# Patient Record
Sex: Male | Born: 1960 | Race: White | Hispanic: No | Marital: Married | Smoking: Current every day smoker
Health system: Southern US, Community
[De-identification: ages and names within clinical notes are randomized; demographics above are authoritative.]

## PROBLEM LIST (undated history)

## (undated) DIAGNOSIS — R0789 Other chest pain: Secondary | ICD-10-CM

## (undated) HISTORY — PX: TONSILLECTOMY: SUR1361

## (undated) HISTORY — DX: Other chest pain: R07.89

## (undated) HISTORY — PX: VASECTOMY: SHX75

---

## 1998-03-04 ENCOUNTER — Encounter: Payer: Self-pay | Admitting: Hematology & Oncology

## 1998-03-04 ENCOUNTER — Ambulatory Visit (HOSPITAL_COMMUNITY): Admission: RE | Admit: 1998-03-04 | Discharge: 1998-03-04 | Payer: Self-pay | Admitting: Hematology & Oncology

## 2007-05-26 ENCOUNTER — Emergency Department (HOSPITAL_COMMUNITY): Admission: EM | Admit: 2007-05-26 | Discharge: 2007-05-26 | Payer: Self-pay | Admitting: Emergency Medicine

## 2011-01-30 LAB — POCT I-STAT CREATININE: Operator id: 146091

## 2011-01-30 LAB — D-DIMER, QUANTITATIVE: D-Dimer, Quant: 0.35

## 2011-01-30 LAB — I-STAT 8, (EC8 V) (CONVERTED LAB)
Chloride: 108
Glucose, Bld: 98
Potassium: 4.2
pCO2, Ven: 44.1 — ABNORMAL LOW
pH, Ven: 7.409 — ABNORMAL HIGH

## 2011-01-30 LAB — POCT CARDIAC MARKERS
CKMB, poc: <1 — ABNORMAL LOW
Myoglobin, poc: 114
Myoglobin, poc: 123
Operator id: 146091
Operator id: 261381
Troponin i, poc: <0.05

## 2011-09-28 ENCOUNTER — Encounter: Payer: Self-pay | Admitting: Cardiology

## 2011-09-28 ENCOUNTER — Ambulatory Visit (INDEPENDENT_AMBULATORY_CARE_PROVIDER_SITE_OTHER): Payer: PRIVATE HEALTH INSURANCE | Admitting: Cardiology

## 2011-09-28 DIAGNOSIS — Z720 Tobacco use: Secondary | ICD-10-CM

## 2011-09-28 DIAGNOSIS — F17200 Nicotine dependence, unspecified, uncomplicated: Secondary | ICD-10-CM

## 2011-09-28 DIAGNOSIS — R079 Chest pain, unspecified: Secondary | ICD-10-CM | POA: Insufficient documentation

## 2011-09-28 NOTE — Patient Instructions (Addendum)
The current medical regimen is effective;  continue present plan and medications.  Your physician has requested that you have an exercise tolerance test. For further information please visit [email or web address] Please also follow instruction sheet, as given.  Smoking Cessation, Tips for Success YOU CAN QUIT SMOKING If you are ready to quit smoking, congratulations! You have chosen to help yourself be healthier. Cigarettes bring nicotine, tar, carbon monoxide, and other irritants into your body. Your lungs, heart, and blood vessels will be able to work better without these poisons. There are many different ways to quit smoking. Nicotine gum, nicotine patches, a nicotine inhaler, or nicotine nasal spray can help with physical craving. Hypnosis, support groups, and medicines help break the habit of smoking. Here are some tips to help you quit for good.  Throw away all cigarettes.   Clean and remove all ashtrays from your home, work, and car.   On a card, write down your reasons for quitting. Carry the card with you and read it when you get the urge to smoke.   Cleanse your body of nicotine. Drink enough water and fluids to keep your urine clear or pale yellow. Do this after quitting to flush the nicotine from your body.   Learn to predict your moods. Do not let a bad situation be your excuse to have a cigarette. Some situations in your life might tempt you into wanting a cigarette.   Never have "just one" cigarette. It leads to wanting another and another. Remind yourself of your decision to quit.   Change habits associated with smoking. If you smoked while driving or when feeling stressed, try other activities to replace smoking. Stand up when drinking your coffee. Brush your teeth after eating. Sit in a different chair when you read the paper. Avoid alcohol while trying to quit, and try to drink fewer caffeinated beverages. Alcohol and caffeine may urge you to smoke.   Avoid foods and drinks  that can trigger a desire to smoke, such as sugary or spicy foods and alcohol.   Ask people who smoke not to smoke around you.   Have something planned to do right after eating or having a cup of coffee. Take a walk or exercise to perk you up. This will help to keep you from overeating.   Try a relaxation exercise to calm you down and decrease your stress. Remember, you may be tense and nervous for the first 2 weeks after you quit, but this will pass.   Find new activities to keep your hands busy. Play with a pen, coin, or rubber band. Doodle or draw things on paper.   Brush your teeth right after eating. This will help cut down on the craving for the taste of tobacco after meals. You can try mouthwash, too.   Use oral substitutes, such as lemon drops, carrots, a cinnamon stick, or chewing gum, in place of cigarettes. Keep them handy so they are available when you have the urge to smoke.   When you have the urge to smoke, try deep breathing.   Designate your home as a nonsmoking area.   If you are a heavy smoker, ask your caregiver about a prescription for nicotine chewing gum. It can ease your withdrawal from nicotine.   Reward yourself. Set aside the cigarette money you save and buy yourself something nice.   Look for support from others. Join a support group or smoking cessation program. Ask someone at home or at work to help you with  your plan to quit smoking.   Always ask yourself, "Do I need this cigarette or is this just a reflex?" Tell yourself, "Today, I choose not to smoke," or "I do not want to smoke." You are reminding yourself of your decision to quit, even if you do smoke a cigarette.  HOW WILL I FEEL WHEN I QUIT SMOKING?  The benefits of not smoking start within days of quitting.   You may have symptoms of withdrawal because your body is used to nicotine (the addictive substance in cigarettes). You may crave cigarettes, be irritable, feel very hungry, cough often, get  headaches, or have difficulty concentrating.   The withdrawal symptoms are only temporary. They are strongest when you first quit but will go away within 10 to 14 days.   When withdrawal symptoms occur, stay in control. Think about your reasons for quitting. Remind yourself that these are signs that your body is healing and getting used to being without cigarettes.   Remember that withdrawal symptoms are easier to treat than the major diseases that smoking can cause.   Even after the withdrawal is over, expect periodic urges to smoke. However, these cravings are generally short-lived and will go away whether you smoke or not. Do not smoke!   If you relapse and smoke again, do not lose hope. Most smokers quit 3 times before they are successful.   If you relapse, do not give up! Plan ahead and think about what you will do the next time you get the urge to smoke.  LIFE AS A NONSMOKER: MAKE IT FOR A MONTH, MAKE IT FOR LIFE Day 1: Hang this page where you will see it every day. Day 2: Get rid of all ashtrays, matches, and lighters. Day 3: Drink water. Breathe deeply between sips. Day 4: Avoid places with smoke-filled air, such as bars, clubs, or the smoking section of restaurants. Day 5: Keep track of how much money you save by not smoking. Day 6: Avoid boredom. Keep a good book with you or go to the movies. Day 7: Reward yourself! One week without smoking! Day 8: Make a dental appointment to get your teeth cleaned. Day 9: Decide how you will turn down a cigarette before it is offered to you. Day 10: Review your reasons for quitting. Day 11: Distract yourself. Stay active to keep your mind off smoking and to relieve tension. Take a walk, exercise, read a book, do a crossword puzzle, or try a new hobby. Day 12: Exercise. Get off the bus before your stop or use stairs instead of escalators. Day 13: Call on friends for support and encouragement. Day 14: Reward yourself! Two weeks without  smoking! Day 15: Practice deep breathing exercises. Day 16: Bet a friend that you can stay a nonsmoker. Day 17: Ask to sit in nonsmoking sections of restaurants. Day 18: Hang up "No Smoking" signs. Day 19: Think of yourself as a nonsmoker. Day 20: Each morning, tell yourself you will not smoke. Day 21: Reward yourself! Three weeks without smoking! Day 22: Think of smoking in negative ways. Remember how it stains your teeth, gives you bad breath, and leaves you short of breath. Day 23: Eat a nutritious breakfast. Day 24:Do not relive your days as a smoker. Day 25: Hold a pencil in your hand when talking on the telephone. Day 26: Tell all your friends you do not smoke. Day 27: Think about how much better food tastes. Day 28: Remember, one cigarette is one too  many. Day 29: Take up a hobby that will keep your hands busy. Day 30: Congratulations! One month without smoking! Give yourself a big reward. Your caregiver can direct you to community resources or hospitals for support, which may include:  Group support.   Education.   Hypnosis.   Subliminal therapy.  Document Released: 01/24/2004 Document Revised: 04/16/2011 Document Reviewed: 02/11/2009 [facility] Patient Information 2012 [place], [place].

## 2011-09-28 NOTE — Progress Notes (Signed)
   HPI The patient presents for evaluation of chest discomfort. He has no prior cardiac history. He had a negative exercise treadmill test in 2009. She reports about one year of chest discomfort. This has been intermittent. He points to a left axillary discomfort that is sharp and stabbing. He might have some sharp fleeting neck discomfort associated with this. He cannot bring this on with activity. Recently he gains for 3 hours without bringing on this discomfort. He does not describe associated symptoms such as nausea vomiting or diaphoresis. He has no new shortness of breath, PND or orthopnea. He does however have cardiovascular risk factors such as tobacco use. His mother also had sudden  death at age 58. She however was not found to have atherosclerotic disease apparently.  No Known Allergies  Current Outpatient Prescriptions  Medication Sig Dispense Refill  . Multiple Vitamins-Minerals (CENTRUM PO) Take by mouth.      . pseudoephedrine (SUDAFED) 30 MG tablet Take 30 mg by mouth every 4 (four) hours as needed.        Past Medical History  Diagnosis Date  . Left-sided chest wall pain   . Bradycardia     Past Surgical History  Procedure Date  . Vasectomy   . Tonsillectomy     No family history on file.  History   Social History  . Marital Status: Married    Spouse Name: N/A    Number of Children: N/A  . Years of Education: N/A   Occupational History  . Not on file.   Social History Main Topics  . Smoking status: Current Everyday Smoker  . Smokeless tobacco: Not on file  . Alcohol Use: Yes  . Drug Use:   . Sexually Active:    Other Topics Concern  . Not on file   Social History Narrative  . No narrative on file    ROS:  Positive for dizziness, seasonal allergies. Otherwise as stated in the HPI and negative for all other systems.  PHYSICAL EXAM BP 154/81  Pulse 61  Ht 6\' 1"  (1.854 m)  Wt 189 lb 1.9 oz (85.784 kg)  BMI 24.95 kg/m2 GENERAL:  Well  appearing HEENT:  Pupils equal round and reactive, fundi not visualized, oral mucosa unremarkable NECK:  No jugular venous distention, waveform within normal limits, carotid upstroke brisk and symmetric, no bruits, no thyromegaly LYMPHATICS:  No cervical, inguinal adenopathy LUNGS:  Clear to auscultation bilaterally BACK:  No CVA tenderness CHEST:  Unremarkable HEART:  PMI not displaced or sustained,S1 and S2 within normal limits, no S3, no S4, no clicks, no rubs, no murmurs ABD:  Flat, positive bowel sounds normal in frequency in pitch, no bruits, no rebound, no guarding, no midline pulsatile mass, no hepatomegaly, no splenomegaly EXT:  2 plus pulses throughout, no edema, no cyanosis no clubbing SKIN:  No rashes no nodules NEURO:  Cranial nerves II through XII grossly intact, motor grossly intact throughout PSYCH:  Cognitively intact, oriented to person place and time  EKG: Sinus rhythm, rate 61, axis within normal limits, intervals within normal limits, no acute ST-T wave changes.  ASSESSMENT AND PLAN

## 2011-09-28 NOTE — Assessment & Plan Note (Signed)
I think the pretest probability of obstructive coronary disease is somewhat low. He does however have risk factors. I will bring the patient back for a POET (Plain Old Exercise Test). This will allow me to screen for obstructive coronary disease, risk stratify and very importantly provide a prescription for exercise.

## 2011-09-28 NOTE — Assessment & Plan Note (Signed)
We discussed a specific strategy for tobacco cessation.  (Greater than 10 minutes discussing tobacco cessation.)  He wants to coordinate this with his wife. He will consider Chantix versus other strategies we discussed. I hope that he is committed to quit smoking.

## 2011-11-02 ENCOUNTER — Ambulatory Visit (INDEPENDENT_AMBULATORY_CARE_PROVIDER_SITE_OTHER): Payer: PRIVATE HEALTH INSURANCE | Admitting: Cardiology

## 2011-11-02 ENCOUNTER — Encounter: Payer: Self-pay | Admitting: Cardiology

## 2011-11-02 DIAGNOSIS — R079 Chest pain, unspecified: Secondary | ICD-10-CM

## 2011-11-02 NOTE — Procedures (Signed)
Exercise Treadmill Test  Pre-Exercise Testing Evaluation Rhythm: normal sinus  Rate: 65   PR:  .13 QRS:  .09  QT:  .39 QTc: .40     Test  Exercise Tolerance Test Ordering MD: Angelina Sheriff, MD  Interpreting MD: Angelina Sheriff, MD  Unique Test No: 1  Treadmill:  1  Indication for ETT: chest pain - rule out ischemia  Contraindication to ETT: No   Stress Modality: exercise - treadmill  Cardiac Imaging Performed: non   Protocol: standard Bruce - maximal  Max BP:  190/69  Max MPHR (bpm):  170 85% MPR (bpm):  145  MPHR obtained (bpm):  151 % MPHR obtained:  86  Reached 85% MPHR (min:sec):  7:02 Total Exercise Time (min-sec):  8:00  Workload in METS:  10.1 Borg Scale: 17  Reason ETT Terminated:  desired heart rate attained    ST Segment Analysis At Rest: normal ST segments - no evidence of significant ST depression With Exercise: no evidence of significant ST depression  Other Information Arrhythmia:  Yes Angina during ETT:  absent (0) Quality of ETT:  diagnostic  ETT Interpretation:  normal - no evidence of ischemia by ST analysis  Comments: The patient had an moderate exercise tolerance.  There was no chest pain.  There was an appropriate level of dyspnea.  There was a normal heart rate response and normal BP response.  He had one ventricular couplet in recovery and rare PVCs. There were no ischemic ST T wave changes.  Recommendations: Negative adequate ETT.  No further testing is indicated.  Based on the above I gave the patient a prescription for exercise.

## 2011-11-03 ENCOUNTER — Encounter: Payer: Self-pay | Admitting: Cardiology

## 2016-11-17 DIAGNOSIS — L989 Disorder of the skin and subcutaneous tissue, unspecified: Secondary | ICD-10-CM | POA: Diagnosis not present

## 2017-01-05 DIAGNOSIS — C44519 Basal cell carcinoma of skin of other part of trunk: Secondary | ICD-10-CM | POA: Diagnosis not present

## 2017-02-15 DIAGNOSIS — Z85828 Personal history of other malignant neoplasm of skin: Secondary | ICD-10-CM | POA: Diagnosis not present

## 2017-02-15 DIAGNOSIS — Z08 Encounter for follow-up examination after completed treatment for malignant neoplasm: Secondary | ICD-10-CM | POA: Diagnosis not present

## 2017-06-09 DIAGNOSIS — D225 Melanocytic nevi of trunk: Secondary | ICD-10-CM | POA: Diagnosis not present

## 2017-06-09 DIAGNOSIS — L988 Other specified disorders of the skin and subcutaneous tissue: Secondary | ICD-10-CM | POA: Diagnosis not present

## 2017-07-07 DIAGNOSIS — Z08 Encounter for follow-up examination after completed treatment for malignant neoplasm: Secondary | ICD-10-CM | POA: Diagnosis not present

## 2017-07-07 DIAGNOSIS — Z85828 Personal history of other malignant neoplasm of skin: Secondary | ICD-10-CM | POA: Diagnosis not present

## 2017-07-21 DIAGNOSIS — J101 Influenza due to other identified influenza virus with other respiratory manifestations: Secondary | ICD-10-CM | POA: Diagnosis not present

## 2017-07-21 DIAGNOSIS — F17200 Nicotine dependence, unspecified, uncomplicated: Secondary | ICD-10-CM | POA: Diagnosis not present

## 2018-03-02 DIAGNOSIS — R030 Elevated blood-pressure reading, without diagnosis of hypertension: Secondary | ICD-10-CM | POA: Diagnosis not present

## 2018-03-02 DIAGNOSIS — L989 Disorder of the skin and subcutaneous tissue, unspecified: Secondary | ICD-10-CM | POA: Diagnosis not present

## 2018-03-02 DIAGNOSIS — F17210 Nicotine dependence, cigarettes, uncomplicated: Secondary | ICD-10-CM | POA: Diagnosis not present

## 2019-03-21 ENCOUNTER — Other Ambulatory Visit: Payer: Self-pay

## 2019-03-21 DIAGNOSIS — Z20822 Contact with and (suspected) exposure to covid-19: Secondary | ICD-10-CM

## 2019-03-24 LAB — NOVEL CORONAVIRUS, NAA: SARS-CoV-2, NAA: NOT DETECTED

## 2019-07-29 ENCOUNTER — Emergency Department (HOSPITAL_COMMUNITY): Payer: 59

## 2019-07-29 ENCOUNTER — Emergency Department (HOSPITAL_COMMUNITY)
Admission: EM | Admit: 2019-07-29 | Discharge: 2019-07-30 | Discharge status: Against Medical Advice | Payer: 59 | Attending: Emergency Medicine | Admitting: Emergency Medicine

## 2019-07-29 ENCOUNTER — Encounter (HOSPITAL_COMMUNITY): Payer: Self-pay

## 2019-07-29 ENCOUNTER — Other Ambulatory Visit: Payer: Self-pay

## 2019-07-29 DIAGNOSIS — R55 Syncope and collapse: Secondary | ICD-10-CM | POA: Diagnosis not present

## 2019-07-29 DIAGNOSIS — R42 Dizziness and giddiness: Secondary | ICD-10-CM | POA: Diagnosis not present

## 2019-07-29 DIAGNOSIS — R0600 Dyspnea, unspecified: Secondary | ICD-10-CM | POA: Insufficient documentation

## 2019-07-29 DIAGNOSIS — R0789 Other chest pain: Secondary | ICD-10-CM | POA: Insufficient documentation

## 2019-07-29 DIAGNOSIS — Z5320 Procedure and treatment not carried out because of patient's decision for unspecified reasons: Secondary | ICD-10-CM | POA: Insufficient documentation

## 2019-07-29 DIAGNOSIS — Z20822 Contact with and (suspected) exposure to covid-19: Secondary | ICD-10-CM | POA: Diagnosis not present

## 2019-07-29 DIAGNOSIS — F17210 Nicotine dependence, cigarettes, uncomplicated: Secondary | ICD-10-CM | POA: Diagnosis not present

## 2019-07-29 LAB — CBC WITH DIFFERENTIAL/PLATELET
Abs Immature Granulocytes: 0.02 10*3/uL (ref 0.00–0.07)
Basophils Absolute: 0.1 10*3/uL (ref 0.0–0.1)
Basophils Relative: 1 %
Eosinophils Absolute: 0.2 10*3/uL (ref 0.0–0.5)
Eosinophils Relative: 2 %
HCT: 40.2 % (ref 39.0–52.0)
Hemoglobin: 13.4 g/dL (ref 13.0–17.0)
Immature Granulocytes: 0 %
Lymphocytes Relative: 51 %
Lymphs Abs: 4.9 10*3/uL — ABNORMAL HIGH (ref 0.7–4.0)
MCH: 32.1 pg (ref 26.0–34.0)
MCHC: 33.3 g/dL (ref 30.0–36.0)
MCV: 96.4 fL (ref 80.0–100.0)
Monocytes Absolute: 0.6 10*3/uL (ref 0.1–1.0)
Monocytes Relative: 6 %
Neutro Abs: 3.8 10*3/uL (ref 1.7–7.7)
Neutrophils Relative %: 40 %
Platelets: 135 10*3/uL — ABNORMAL LOW (ref 150–400)
RBC: 4.17 MIL/uL — ABNORMAL LOW (ref 4.22–5.81)
RDW: 13 % (ref 11.5–15.5)
WBC: 9.5 10*3/uL (ref 4.0–10.5)
nRBC: 0 % (ref 0.0–0.2)

## 2019-07-29 LAB — RESPIRATORY PANEL BY RT PCR (FLU A&B, COVID)
Influenza A by PCR: NEGATIVE
Influenza B by PCR: NEGATIVE
SARS Coronavirus 2 by RT PCR: NEGATIVE

## 2019-07-29 LAB — COMPREHENSIVE METABOLIC PANEL
ALT: 27 U/L (ref 0–44)
AST: 29 U/L (ref 15–41)
Albumin: 3.4 g/dL — ABNORMAL LOW (ref 3.5–5.0)
Alkaline Phosphatase: 67 U/L (ref 38–126)
Anion gap: 10 (ref 5–15)
BUN: 14 mg/dL (ref 6–20)
CO2: 21 mmol/L — ABNORMAL LOW (ref 22–32)
Calcium: 8.6 mg/dL — ABNORMAL LOW (ref 8.9–10.3)
Chloride: 105 mmol/L (ref 98–111)
Creatinine, Ser: 1.23 mg/dL (ref 0.61–1.24)
GFR calc Af Amer: >60 mL/min (ref >60)
GFR calc non Af Amer: >60 mL/min (ref >60)
Glucose, Bld: 111 mg/dL — ABNORMAL HIGH (ref 70–99)
Potassium: 3.6 mmol/L (ref 3.5–5.1)
Sodium: 136 mmol/L (ref 135–145)
Total Bilirubin: 0.6 mg/dL (ref 0.3–1.2)
Total Protein: 6.4 g/dL — ABNORMAL LOW (ref 6.5–8.1)

## 2019-07-29 LAB — TROPONIN I (HIGH SENSITIVITY): Troponin I (High Sensitivity): <2 ng/L (ref <18)

## 2019-07-29 LAB — I-STAT CHEM 8, ED
BUN: 14 mg/dL (ref 6–20)
Calcium, Ion: 1.08 mmol/L — ABNORMAL LOW (ref 1.15–1.40)
Chloride: 104 mmol/L (ref 98–111)
Creatinine, Ser: 1.2 mg/dL (ref 0.61–1.24)
Glucose, Bld: 104 mg/dL — ABNORMAL HIGH (ref 70–99)
HCT: 38 % — ABNORMAL LOW (ref 39.0–52.0)
Hemoglobin: 12.9 g/dL — ABNORMAL LOW (ref 13.0–17.0)
Potassium: 3.5 mmol/L (ref 3.5–5.1)
Sodium: 137 mmol/L (ref 135–145)
TCO2: 22 mmol/L (ref 22–32)

## 2019-07-29 MED ORDER — SODIUM CHLORIDE 0.9 % IV BOLUS
1000.0000 mL | Freq: Once | INTRAVENOUS | Status: AC
  Administered 2019-07-29: 1000 mL via INTRAVENOUS

## 2019-07-29 MED ORDER — ONDANSETRON HCL 4 MG/2ML IJ SOLN
4.0000 mg | Freq: Once | INTRAMUSCULAR | Status: AC
  Administered 2019-07-29: 4 mg via INTRAVENOUS
  Filled 2019-07-29: qty 2

## 2019-07-29 NOTE — ED Provider Notes (Signed)
[facility] EMERGENCY DEPARTMENT Provider Note   CSN: [id number] Arrival date & time: 07/29/19  2111     History Chief Complaint  Patient presents with  . Code STEMI    Michael James is a 59 y.o. male.  HPI 59 year old male presents as a possible STEMI.  He was at dinner and had finished eating and felt lightheaded.  Next thing he knows he woke up and his wife had called EMS.  Wife states he passed out and just prior to waking up his lips were blue.  He has a history of lightheadedness in the past.  He did develop some chest tightness on the way here but qualifies it as mild, at max a 2 out of 10.  Chronic dyspnea that is unchanged.  Longstanding smoking history.  ECG was concerning for possible STEMI so STEMI was activated.  Cardiology has seen patient and canceled code STEMI.  Patient states he feels a little bit lightheaded now but otherwise fine.  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  No severe headache.   Past Medical History:  Diagnosis Date  . Left-sided chest wall pain     Patient Active Problem List   Diagnosis Date Noted  . Chest pain 09/28/2011  . Tobacco abuse 09/28/2011    Past Surgical History:  Procedure Laterality Date  . TONSILLECTOMY    . VASECTOMY         Family History  Problem Relation Age of Onset  . Sudden death Mother        Undetermined.  No CAD  . Heart failure Maternal Grandmother 32    Social History   Tobacco Use  . Smoking status: Current Every Day Smoker    Packs/day: 1.50    Years: 35.00    Pack years: 52.50    Types: Cigarettes  Substance Use Topics  . Alcohol use: Yes  . Drug use: Not on file    Home Medications Prior to Admission medications   Medication Sig Start Date End Date Taking? Authorizing Provider  acetaminophen (TYLENOL) 325 MG tablet Take 650 mg by mouth every 6 (six) hours as needed for mild pain, fever or headache.   Yes [provider]  Chlorpheniramine Maleate (ALLERGY PO) Take 1 tablet by mouth daily as needed (allergies).   Yes [provider]  ibuprofen (ADVIL) 200 MG tablet Take 200 mg by mouth every 6 (six) hours as needed for fever or moderate pain.   Yes [provider]    Allergies    Patient has no known allergies.  Review of Systems   Review of Systems  Respiratory: Positive for chest tightness. Negative for shortness of breath.   Cardiovascular: Positive for chest pain.  Gastrointestinal: Negative for abdominal pain, diarrhea and vomiting.  Neurological: Positive for syncope and light-headedness. Negative for headaches.  All other systems reviewed and are negative.   Physical Exam Updated Vital Signs BP 107/69 (BP Location: Left Arm)   Pulse 61   Temp 97.6 F (36.4 C) (Oral)   Resp (!) 21   Ht 6\' 1"  (1.854 m)   Wt 83.9 kg   SpO2 98%   BMI 24.41 kg/m   Physical Exam Vitals and nursing note reviewed.  Constitutional:      Appearance: He is well-developed.  HENT:     Head: Normocephalic and atraumatic.     Right Ear: External ear normal.  Left Ear: External ear normal.     Nose: Nose normal.  Eyes:     General:        Right eye: No discharge.        Left eye: No discharge.  Cardiovascular:     Rate and Rhythm: Regular rhythm. Bradycardia present.     Heart sounds: Normal heart sounds. No murmur.  Pulmonary:     Effort: Pulmonary effort is normal.     Breath sounds: Normal breath sounds.  Abdominal:     Palpations: Abdomen is soft.     Tenderness: There is no abdominal tenderness.  Musculoskeletal:     Cervical back: Neck supple.  Skin:    General: Skin is warm and dry.  Neurological:     Mental Status: He is alert.  Psychiatric:        Mood and Affect: Mood is not anxious.     ED Results / Procedures / Treatments   Labs (all labs ordered are listed, but only abnormal results are displayed) Labs Reviewed  COMPREHENSIVE METABOLIC PANEL -  Abnormal; Notable for the following components:      Result Value   CO2 21 (*)    Glucose, Bld 111 (*)    Calcium 8.6 (*)    Total Protein 6.4 (*)    Albumin 3.4 (*)    All other components within normal limits  CBC WITH DIFFERENTIAL/PLATELET - Abnormal; Notable for the following components:   RBC 4.17 (*)    Platelets 135 (*)    Lymphs Abs 4.9 (*)    All other components within normal limits  I-STAT CHEM 8, ED - Abnormal; Notable for the following components:   Glucose, Bld 104 (*)    Calcium, Ion 1.08 (*)    Hemoglobin 12.9 (*)    HCT 38.0 (*)    All other components within normal limits  RESPIRATORY PANEL BY RT PCR (FLU A&B, COVID)  TROPONIN I (HIGH SENSITIVITY)  TROPONIN I (HIGH SENSITIVITY)    EKG EKG Interpretation  Date/Time:  Saturday July 29 2019 23:17:26 EDT Ventricular Rate:  49 PR Interval:    QRS Duration: 84 QT Interval:  466 QTC Calculation: 421 R Axis:   80 Text Interpretation: Sinus bradycardia Anteroseptal infarct, age indeterminate ST elevation mild and similar to earlier, no acute change from earlier in the day Confirmed by Sherwood Gambler [phone]) on 07/29/2019 11:31:55 PM   Radiology No results found.  Procedures Procedures (including critical care time)  Medications Ordered in ED Medications  sodium chloride 0.9 % bolus 1,000 mL (1,000 mLs Intravenous New Bag/Given 07/29/19 2132)  ondansetron (ZOFRAN) injection 4 mg (4 mg Intravenous Given 07/29/19 2149)    ED Course  I have reviewed the triage vital signs and the nursing notes.  Pertinent labs & imaging results that were available during my care of the patient were reviewed by me and considered in my medical decision making (see chart for details).    MDM Rules/Calculators/A&P                      Patient presents as a STEMI alert but his ECG shows minimal elevations that are not really appearing ischemic.  To me this appears similar to many years ago and repeat in the ED is unchanged.   Cardiology has canceled code STEMI.  His presentation is more consistent with syncope than STEMI anyway.  He does not have any back pain or severe chest pain that would suggest dissection.  No chest pain/shortness of breath that would suggest PE and he is not hypoxic or tachycardic.  He has not been drinking as much water today which could lead to some syncope.  The only weird part is that he was sitting when this occurred but he did feel a prodrome of dizziness leading up to the syncope.  Initial troponin is negative.  He will need second given how early his troponin was in relation to his episode.  I offered observation given unclear cause of his syncope but he declines after risks/benefits discussed.  His wife is in agreement.  I do not think this is unreasonable but we discussed the need to follow-up closely with his PCP and to return if anything were to recur or worsen.  Second troponin is pending when care transferred to Dr. Manus Gunning. Final Clinical Impression(s) / ED Diagnoses Final diagnoses:  Syncope and collapse    Rx / DC Orders ED Discharge Orders    None       Pricilla Loveless, MD 07/29/19 2359

## 2019-07-29 NOTE — ED Notes (Signed)
Pt had syncopal episode "approx 30 min ago". Woke up vomiting, diaphoretic. Arrived to room 24 via EMS stretcher vomiting. A+Ox4

## 2019-07-29 NOTE — ED Notes (Signed)
MD Goldston at bedside  

## 2019-07-29 NOTE — ED Notes (Signed)
MD Turner at bedside

## 2019-07-30 ENCOUNTER — Emergency Department (HOSPITAL_COMMUNITY): Payer: 59

## 2019-07-30 LAB — D-DIMER, QUANTITATIVE (NOT AT ARMC): D-Dimer, Quant: 1.65 ug/mL-FEU — ABNORMAL HIGH (ref 0.00–0.50)

## 2019-07-30 LAB — TROPONIN I (HIGH SENSITIVITY): Troponin I (High Sensitivity): <2 ng/L (ref <18)

## 2019-07-30 MED ORDER — IOHEXOL 350 MG/ML SOLN
100.0000 mL | Freq: Once | INTRAVENOUS | Status: AC | PRN
  Administered 2019-07-30: 100 mL via INTRAVENOUS

## 2019-07-30 NOTE — ED Notes (Signed)
Pt taken to CT by transport

## 2019-07-30 NOTE — ED Provider Notes (Signed)
Patient seen by Dr. Criss Alvine with dizzy spell with loss of consciousness and subsequent chest tightness.  Code STEMI called in the field was canceled by cardiology.  Patient denies any chest pain currently did have prodrome of dizziness and lightheadedness and nausea.  EKG without acute ST elevation.  Troponin negative x2.  Patient did have prodrome of dizziness and lightheadedness.  He does not want to be admitted to the hospital.  Dr. Criss Alvine discussed this with him as well.  I attempted to convince patient to stay overnight but he was unwilling but did agree to proceed with CT to rule out pulmonary embolism.  Patient apparently eloped from the ED while I was with a critical patient.  Was not able to speak to him before he left.  The read of his CT angiogram study was still pending.  CT is negative for PE.  Does show atelectasis.  Second troponin was negative.  Patient left the ED before I could speak with him.   Glynn Octave, MD 07/30/19 [phone]

## 2019-07-30 NOTE — ED Notes (Addendum)
Pt verbalized understanding dangers of leaving AMA which include death and serious injury. Pt fully alert and oriented at time of departure. IV's removed.

## 2020-09-30 ENCOUNTER — Other Ambulatory Visit: Payer: Self-pay | Admitting: Family Medicine

## 2020-09-30 DIAGNOSIS — F17210 Nicotine dependence, cigarettes, uncomplicated: Secondary | ICD-10-CM

## 2020-10-01 ENCOUNTER — Other Ambulatory Visit: Payer: Self-pay | Admitting: Family Medicine

## 2020-10-01 DIAGNOSIS — F17210 Nicotine dependence, cigarettes, uncomplicated: Secondary | ICD-10-CM

## 2020-10-08 ENCOUNTER — Inpatient Hospital Stay: Payer: 59 | Attending: Hematology and Oncology

## 2020-10-08 ENCOUNTER — Inpatient Hospital Stay (HOSPITAL_BASED_OUTPATIENT_CLINIC_OR_DEPARTMENT_OTHER): Payer: 59 | Admitting: Hematology and Oncology

## 2020-10-08 ENCOUNTER — Encounter: Payer: Self-pay | Admitting: Hematology and Oncology

## 2020-10-08 ENCOUNTER — Other Ambulatory Visit: Payer: Self-pay

## 2020-10-08 VITALS — BP 148/87 | HR 68 | Temp 97.9°F | Resp 17 | Ht 73.0 in | Wt 179.7 lb

## 2020-10-08 DIAGNOSIS — F17210 Nicotine dependence, cigarettes, uncomplicated: Secondary | ICD-10-CM | POA: Insufficient documentation

## 2020-10-08 DIAGNOSIS — D696 Thrombocytopenia, unspecified: Secondary | ICD-10-CM | POA: Diagnosis present

## 2020-10-08 DIAGNOSIS — R61 Generalized hyperhidrosis: Secondary | ICD-10-CM

## 2020-10-08 DIAGNOSIS — R079 Chest pain, unspecified: Secondary | ICD-10-CM | POA: Insufficient documentation

## 2020-10-08 DIAGNOSIS — R072 Precordial pain: Secondary | ICD-10-CM

## 2020-10-08 LAB — CMP (CANCER CENTER ONLY)
ALT: 17 U/L (ref 0–44)
AST: 22 U/L (ref 15–41)
Albumin: 4 g/dL (ref 3.5–5.0)
Alkaline Phosphatase: 65 U/L (ref 38–126)
Anion gap: 8 (ref 5–15)
BUN: 13 mg/dL (ref 6–20)
CO2: 26 mmol/L (ref 22–32)
Calcium: 9.5 mg/dL (ref 8.9–10.3)
Chloride: 105 mmol/L (ref 98–111)
Creatinine: 1.16 mg/dL (ref 0.61–1.24)
GFR, Estimated: >60 mL/min (ref >60)
Glucose, Bld: 92 mg/dL (ref 70–99)
Potassium: 4.5 mmol/L (ref 3.5–5.1)
Sodium: 139 mmol/L (ref 135–145)
Total Bilirubin: 0.4 mg/dL (ref 0.3–1.2)
Total Protein: 7.1 g/dL (ref 6.5–8.1)

## 2020-10-08 LAB — RETICULOCYTES
Immature Retic Fract: 3.4 % (ref 2.3–15.9)
RBC.: 4.76 MIL/uL (ref 4.22–5.81)
Retic Count, Absolute: 59 10*3/uL (ref 19.0–186.0)
Retic Ct Pct: 1.2 % (ref 0.4–3.1)

## 2020-10-08 LAB — CBC WITH DIFFERENTIAL/PLATELET
Abs Immature Granulocytes: 0.02 10*3/uL (ref 0.00–0.07)
Basophils Absolute: 0 10*3/uL (ref 0.0–0.1)
Basophils Relative: 0 %
Eosinophils Absolute: 0.1 10*3/uL (ref 0.0–0.5)
Eosinophils Relative: 1 %
HCT: 43.2 % (ref 39.0–52.0)
Hemoglobin: 15.3 g/dL (ref 13.0–17.0)
Immature Granulocytes: 0 %
Lymphocytes Relative: 28 %
Lymphs Abs: 1.9 10*3/uL (ref 0.7–4.0)
MCH: 32.6 pg (ref 26.0–34.0)
MCHC: 35.4 g/dL (ref 30.0–36.0)
MCV: 91.9 fL (ref 80.0–100.0)
Monocytes Absolute: 0.4 10*3/uL (ref 0.1–1.0)
Monocytes Relative: 6 %
Neutro Abs: 4.2 10*3/uL (ref 1.7–7.7)
Neutrophils Relative %: 65 %
Platelets: 117 10*3/uL — ABNORMAL LOW (ref 150–400)
RBC: 4.7 MIL/uL (ref 4.22–5.81)
RDW: 12.4 % (ref 11.5–15.5)
WBC: 6.5 10*3/uL (ref 4.0–10.5)
nRBC: 0 % (ref 0.0–0.2)

## 2020-10-08 LAB — IRON AND TIBC
Iron: 84 ug/dL (ref 42–163)
Saturation Ratios: 36 % (ref 20–55)
TIBC: 236 ug/dL (ref 202–409)
UIBC: 152 ug/dL (ref 117–376)

## 2020-10-08 LAB — LACTATE DEHYDROGENASE: LDH: 175 U/L (ref 98–192)

## 2020-10-08 LAB — VITAMIN B12: Vitamin B-12: 449 pg/mL (ref 180–914)

## 2020-10-08 LAB — FERRITIN: Ferritin: 245 ng/mL (ref 24–336)

## 2020-10-08 NOTE — Assessment & Plan Note (Signed)
Patient complains of some ongoing intermittent left chest pressure.  Encouraged him to talk to his PCP and his cardiologist to rule out any cardiac etiology.  He expressed understanding.

## 2020-10-08 NOTE — Assessment & Plan Note (Signed)
This is a very pleasant 60 year old male patient with no significant past medical history referred to hematology for evaluation of persistent mild thrombocytopenia.  Mr. Obrian had no complaints except for some fatty tumors which are chronic, occasional left-sided chest pain which is dull and chronic night sweats.  Physical examination today, healthy appearing male patient, no palpable lymphadenopathy or hepatosplenomegaly.  I reviewed his labs from 2021 and 2022 which showed mild thrombocytopenia, no other cytopenias.  CMP normal, no evidence of hemolysis.  TSH normal.  Cholesterol profile normal. We have discussed about common causes of thrombocytopenia including but not limited to viral infections, medications, nutritional deficiencies, autoimmune diseases, liver disorders, bone marrow disorders.  We have discussed about proceeding with some work-up today and returning to clinic in 2 weeks for a video visit to review labs and to discuss recommendations.  We have discussed about possible surveillance versus bone marrow aspiration and biopsy in some occasions.  He is agreeable to all these recommendations. All his questions were answered to the best of my knowledge. Thank you for consulting [place] in the care of this patient.  Please do not hesitate to contact us with any other questions or concerns.

## 2020-10-08 NOTE — Progress Notes (Signed)
[facility] CONSULT NOTE  Patient Care Team: Deatra James, MD as PCP - General (Family Medicine)  CHIEF COMPLAINTS/PURPOSE OF CONSULTATION:  Thrombocytopenia.  ASSESSMENT & PLAN:  Thrombocytopenia (HCC) This is a very pleasant 60 year old male patient with no significant past medical history referred to hematology for evaluation of persistent mild thrombocytopenia.  [name] had no complaints except for some fatty tumors which are chronic, occasional left-sided chest pain which is dull and chronic night sweats.  Physical examination today, healthy appearing male patient, no palpable lymphadenopathy or hepatosplenomegaly.  I reviewed his labs from 2021 and 2022 which showed mild thrombocytopenia, no other cytopenias.  CMP normal, no evidence of hemolysis.  TSH normal.  Cholesterol profile normal. We have discussed about common causes of thrombocytopenia including but not limited to viral infections, medications, nutritional deficiencies, autoimmune diseases, liver disorders, bone marrow disorders.  We have discussed about proceeding with some work-up today and returning to clinic in 2 weeks for a video visit to review labs and to discuss recommendations.  We have discussed about possible surveillance versus bone marrow aspiration and biopsy in some occasions.  He is agreeable to all these recommendations. All his questions were answered to the best of my knowledge. Thank you for consulting [place] in the care of this patient.  Please do not hesitate to contact us with any other questions or concerns.  Chest pain Patient complains of some ongoing intermittent left chest pressure.  Encouraged him to talk to his PCP and his cardiologist to rule out any cardiac etiology.  He expressed understanding.  Orders Placed This Encounter  Procedures  . CBC with Differential/Platelet    Standing Status:   Standing    Number of Occurrences:   22    Standing Expiration Date:   10/08/2021  . Iron and  TIBC    Standing Status:   Future    Number of Occurrences:   1    Standing Expiration Date:   10/08/2021  . Ferritin    Standing Status:   Future    Number of Occurrences:   1    Standing Expiration Date:   10/08/2021  . Vitamin B12    Standing Status:   Future    Number of Occurrences:   1    Standing Expiration Date:   10/08/2021  . Folate RBC    Standing Status:   Future    Number of Occurrences:   1    Standing Expiration Date:   10/08/2021  . Lactate dehydrogenase    Standing Status:   Future    Number of Occurrences:   1    Standing Expiration Date:   10/08/2021  . Reticulocytes    Standing Status:   Future    Number of Occurrences:   1    Standing Expiration Date:   10/08/2021  . Hepatitis panel, acute    Standing Status:   Future    Number of Occurrences:   1    Standing Expiration Date:   10/08/2021  . CMP (Cancer Center only)    Standing Status:   Future    Number of Occurrences:   1    Standing Expiration Date:   10/08/2021  . Pathologist smear review    thrombocytopenia    Standing Status:   Future    Number of Occurrences:   1    Standing Expiration Date:   10/08/2021    Thrombocytopenia is defined as a platelet count below the lower limit  of normal (ie, <150,000/microL [150 x 109/L] for adults).  Degrees of thrombocytopenia can be further subdivided into mild (platelet count 100,000 to 150,000/microL), moderate (50,000 to 99,000/microL), and severe (<50,000/microL) Most common causes of thrombocytopenia include but not limited to chronic liver disease or hypersplenism, immune thrombocytopenia, viral infections such as Hepatitis, HIV, active bacterial infections, autoimmune diseases, alcohol, nutritional deficiencies and medications. Rarely bone marrow disorders such as myelodysplatic syndrome, bone marrow failure syndromes, acute leukemia and PNH can present with thrombocytopenia. Additional rare causes of thrombocytopenia include vascular conditions associated with  platelet destruction (eg, giant capillary hemangioma, large aortic aneurysms, cardiopulmonary bypass, intraaortic balloon pumps.   HISTORY OF PRESENTING ILLNESS:  Michael James 60 y.o. male is here because of thrombocytopenia.  This is a very pleasant 60 year old male patient with no significant past medical history referred to hematology for evaluation of thrombocytopenia.  Mr. Michael James is a very healthy male patient with no significant past medical history who was found to have thrombocytopenia since 2021.  He denies any complaints such as fevers, drenching night sweats (he has chronic night sweats which have not changed) loss of appetite or loss of weight.  His weight has pretty much remained the same for the past decade.  He denies any known nutritional deficiencies, viral infections, regular alcohol use, autoimmune diseases.  He does smoke and use marijuana on a regular basis. No known liver diseases such as hepatitis.  He denies any known COVID-19 infection.  He does complain of occasional lightheadedness since he was a kid especially when he stands up in the lying for too long, occasional left-sided dull chest pressure for which she saw cardiologist many years ago.  No hematochezia, melena or hematuria.  Sudden death in mom at the age of 60, no clear cause. Rest of the pertinent 10 point ROS reviewed and negative.  REVIEW OF SYSTEMS:   Constitutional: Denies fevers, chills or abnormal night sweats Eyes: Denies blurriness of vision, double vision or watery eyes Ears, nose, mouth, throat, and face: Denies mucositis or sore throat Respiratory: Denies cough, dyspnea or wheezes Cardiovascular: Denies palpitation, chest discomfort or lower extremity swelling Gastrointestinal:  Denies nausea, heartburn or change in bowel habits Skin: Denies abnormal skin rashes Lymphatics: Denies new lymphadenopathy or easy bruising Neurological:Denies numbness, tingling or new weaknesses Behavioral/Psych: Mood is  stable, no new changes  All other systems were reviewed with the patient and are negative.  MEDICAL HISTORY:  Past Medical History:  Diagnosis Date  . Left-sided chest wall pain     SURGICAL HISTORY: Past Surgical History:  Procedure Laterality Date  . TONSILLECTOMY    . VASECTOMY      SOCIAL HISTORY: Social History   Socioeconomic History  . Marital status: Married    Spouse name: Not on file  . Number of children: 2  . Years of education: Not on file  . Highest education level: Not on file  Occupational History    Employer: [employer]  Tobacco Use  . Smoking status: Current Every Day Smoker    Packs/day: 1.50    Years: 35.00    Pack years: 52.50    Types: Cigarettes  . Smokeless tobacco: Not on file  Substance and Sexual Activity  . Alcohol use: Yes  . Drug use: Not on file  . Sexual activity: Not on file  Other Topics Concern  . Not on file  Social History Narrative  . Not on file   Social Determinants of Health   Financial Resource  Strain: Not on file  Food Insecurity: Not on file  Transportation Needs: Not on file  Physical Activity: Not on file  Stress: Not on file  Social Connections: Not on file  Intimate Partner Violence: Not on file    FAMILY HISTORY: Family History  Problem Relation Age of Onset  . Sudden death Mother        Undetermined.  No CAD  . Heart failure Maternal Grandmother 63    ALLERGIES:  has No Known Allergies.  MEDICATIONS:  Current Outpatient Medications  Medication Sig Dispense Refill  . acetaminophen (TYLENOL) 325 MG tablet Take 650 mg by mouth every 6 (six) hours as needed for mild pain, fever or headache.    . Chlorpheniramine Maleate (ALLERGY PO) Take 1 tablet by mouth daily as needed (allergies).    Marland Kitchen ibuprofen (ADVIL) 200 MG tablet Take 200 mg by mouth every 6 (six) hours as needed for fever or moderate pain.     No current facility-administered medications for this visit.     PHYSICAL  EXAMINATION:  ECOG PERFORMANCE STATUS: 0 - Asymptomatic  Vitals:   10/08/20 1031  BP: (!) 148/87  Pulse: 68  Resp: 17  Temp: 97.9 F (36.6 C)  SpO2: 100%   Filed Weights   10/08/20 1031  Weight: 179 lb 11.2 oz (81.5 kg)    GENERAL:alert, no distress and comfortable SKIN: skin color, texture, turgor are normal, no rashes or significant lesions EYES: normal, conjunctiva are pink and non-injected, sclera clear OROPHARYNX:no exudate, no erythema and lips, buccal mucosa, and tongue normal  NECK: supple, thyroid normal size, non-tender, without nodularity LYMPH:  no palpable lymphadenopathy in the cervical, axillary or inguinal LUNGS: clear to auscultation and percussion with normal breathing effort HEART: regular rate & rhythm and no murmurs and no lower extremity edema ABDOMEN:abdomen soft, non-tender and normal bowel sounds Musculoskeletal:no cyanosis of digits and no clubbing  PSYCH: alert & oriented x 3 with fluent speech NEURO: no focal motor/sensory deficits  LABORATORY DATA:  I have reviewed the data as listed Lab Results  Component Value Date   WBC 9.5 07/29/2019   HGB 13.4 07/29/2019   HCT 40.2 07/29/2019   MCV 96.4 07/29/2019   PLT 135 (L) 07/29/2019     Chemistry      Component Value Date/Time   NA 136 07/29/2019 2128   K 3.6 07/29/2019 2128   CL 105 07/29/2019 2128   CO2 21 (L) 07/29/2019 2128   BUN 14 07/29/2019 2128   CREATININE 1.23 07/29/2019 2128      Component Value Date/Time   CALCIUM 8.6 (L) 07/29/2019 2128   ALKPHOS 67 07/29/2019 2128   AST 29 07/29/2019 2128   ALT 27 07/29/2019 2128   BILITOT 0.6 07/29/2019 2128     I have reviewed labs sent by his PCP Mild thrombocytopenia, no other pertinent findings.  RADIOGRAPHIC STUDIES: I have personally reviewed the radiological images as listed and agreed with the findings in the report. No results found.  All questions were answered. The patient knows to call the clinic with any problems,  questions or concerns. I spent 45 minutes in the care of this patient including H and P, review of records, counseling and coordination of care. We have reviewed about common causes of thrombocytopenia, necessary lab work-up and possible differential diagnosis.  We have discussed about lung cancer screening given he is active smoker and age-appropriate cancer screening otherwise.    Rachel Moulds, MD 10/08/2020 11:25 AM

## 2020-10-09 LAB — HEPATITIS PANEL, ACUTE
HCV Ab: NONREACTIVE
Hep A IgM: NONREACTIVE
Hep B C IgM: NONREACTIVE
Hepatitis B Surface Ag: NONREACTIVE

## 2020-10-09 LAB — PATHOLOGIST SMEAR REVIEW

## 2020-10-09 LAB — FOLATE RBC
Folate, Hemolysate: 385 ng/mL
Folate, RBC: 848 ng/mL (ref >498)
Hematocrit: 45.4 % (ref 37.5–51.0)

## 2020-10-21 NOTE — Progress Notes (Signed)
[facility] CONSULT NOTE  Patient Care Team: Michael James, MD as PCP - General (Family Medicine)  CHIEF COMPLAINTS/PURPOSE OF CONSULTATION:  Thrombocytopenia.  ASSESSMENT & PLAN:   Thrombocytopenia (HCC) This is a very pleasant 60 year old male patient with no significant past medical history who was referred to hematology for evaluation of thrombocytopenia.  During his initial visit, he denied any complaints and no concerning physical examination findings.  We have discussed about common causes of thrombocytopenia including but not limited to nutritional deficiencies, liver diseases, autoimmune diseases, bone marrow disorders, viral infections and some foreign devices in the body which can lead to thrombocytopenia.  We have agreed to work-up and return to clinic for telehealth visit.  He is here for video visit, still doing very well, no complaints. Labs from recent visit did not show any evidence of nutritional deficiency, hemolysis, liver disease, hepatitis.  He does have mildly low platelet count at 117,000 smear confirmed thrombocytopenia. We have discussed 2 options today.  We have discussed about surveillance and follow-up with repeat labs in 3 months versus considering bone marrow aspiration and biopsy.  We have discussed about possible bone marrow disorders which can present with thrombocytopenia.  Since he is clinically asymptomatic and thrombocytopenia is mild, he would like to proceed with surveillance at this time. We have discussed about warning symptoms and signs to reach out to me including petechial rash, B symptoms,, bleeding, epistaxis, or any other new concerns.  He expressed understanding.  I have shown him a picture of petechial rash to pay attention to. He will return to clinic in 3 months with repeat labs.  No orders of the defined types were placed in this encounter.   HISTORY OF PRESENTING ILLNESS:  Michael James James 60 y.o. male is here because of  thrombocytopenia.  This is a very pleasant 60 year old male patient with no significant past medical history referred to hematology for evaluation of thrombocytopenia.  Michael James James is a very healthy male patient with no significant past medical history who was found to have thrombocytopenia since 2021.    Interval History  REVIEW OF SYSTEMS:   Constitutional: Denies fevers, chills or abnormal night sweats Eyes: Denies blurriness of vision, double vision or watery eyes Ears, nose, mouth, throat, and face: Denies mucositis or sore throat Respiratory: Denies cough, dyspnea or wheezes Cardiovascular: Denies palpitation, chest discomfort or lower extremity swelling Gastrointestinal:  Denies nausea, heartburn or change in bowel habits Skin: Denies abnormal skin rashes Lymphatics: Denies new lymphadenopathy or easy bruising Neurological:Denies numbness, tingling or new weaknesses Behavioral/Psych: Mood is stable, no new changes  All other systems were reviewed with the patient and are negative.  MEDICAL HISTORY:  Past Medical History:  Diagnosis Date   Left-sided chest wall pain     SURGICAL HISTORY: Past Surgical History:  Procedure Laterality Date   TONSILLECTOMY     VASECTOMY      SOCIAL HISTORY: Social History   Socioeconomic History   Marital status: Married    Spouse name: Not on file   Number of children: 2   Years of education: Not on file   Highest education level: Not on file  Occupational History    Employer: [employer]  Tobacco Use   Smoking status: Every Day    Packs/day: 1.50    Years: 35.00    Pack years: 52.50    Types: Cigarettes   Smokeless tobacco: Not on file  Substance and Sexual Activity   Alcohol use: Yes  Drug use: Not on file   Sexual activity: Not on file  Other Topics Concern   Not on file  Social History Narrative   Not on file   Social Determinants of Health   Financial Resource Strain: Not on file  Food Insecurity: Not on  file  Transportation Needs: Not on file  Physical Activity: Not on file  Stress: Not on file  Social Connections: Not on file  Intimate Partner Violence: Not on file    FAMILY HISTORY: Family History  Problem Relation Age of Onset   Sudden death Mother        Undetermined.  No CAD   Heart failure Maternal Grandmother 63    ALLERGIES:  has No Known Allergies.  MEDICATIONS:  Current Outpatient Medications  Medication Sig Dispense Refill   acetaminophen (TYLENOL) 325 MG tablet Take 650 mg by mouth every 6 (six) hours as needed for mild pain, fever or headache.     Chlorpheniramine Maleate (ALLERGY PO) Take 1 tablet by mouth daily as needed (allergies).     ibuprofen (ADVIL) 200 MG tablet Take 200 mg by mouth every 6 (six) hours as needed for fever or moderate pain.     No current facility-administered medications for this visit.     PHYSICAL EXAMINATION:  ECOG PERFORMANCE STATUS: 0 - Asymptomatic  V/S and PE not done, telehealth visit Patient looked healthy and in no distress.  LABORATORY DATA:  I have reviewed the data as listed Lab Results  Component Value Date   WBC 6.5 10/08/2020   HGB 15.3 10/08/2020   HCT 45.4 10/08/2020   MCV 91.9 10/08/2020   PLT 117 (L) 10/08/2020     Chemistry      Component Value Date/Time   NA 139 10/08/2020 1114   K 4.5 10/08/2020 1114   CL 105 10/08/2020 1114   CO2 26 10/08/2020 1114   BUN 13 10/08/2020 1114   CREATININE 1.16 10/08/2020 1114      Component Value Date/Time   CALCIUM 9.5 10/08/2020 1114   ALKPHOS 65 10/08/2020 1114   AST 22 10/08/2020 1114   ALT 17 10/08/2020 1114   BILITOT 0.4 10/08/2020 1114      RADIOGRAPHIC STUDIES: I have personally reviewed the radiological images as listed and agreed with the findings in the report. I have reviewed all the labs, no concerns, only mild thrombocytopenia.  I connected with  Michael James James on 10/22/20 by a video enabled telemedicine application and verified that I am  speaking with the correct person using two identifiers.   I discussed the limitations of evaluation and management by telemedicine. The patient expressed understanding and agreed to proceed.  I spent 20 minutes in the care of this patient including review of records, discussion about possible bone marrow causes of thrombocytopenia, role of BMB, symptoms and signs to watch when platelet count drops critically low.  No results found.      Rachel Moulds, MD 10/22/2020 8:49 AM

## 2020-10-22 ENCOUNTER — Inpatient Hospital Stay: Payer: 59 | Attending: Hematology and Oncology | Admitting: Hematology and Oncology

## 2020-10-22 ENCOUNTER — Encounter: Payer: Self-pay | Admitting: Hematology and Oncology

## 2020-10-22 DIAGNOSIS — D696 Thrombocytopenia, unspecified: Secondary | ICD-10-CM

## 2020-10-22 NOTE — Assessment & Plan Note (Signed)
This is a very pleasant 60 year old male patient with no significant past medical history who was referred to hematology for evaluation of thrombocytopenia.  During his initial visit, he denied any complaints and no concerning physical examination findings.  We have discussed about common causes of thrombocytopenia including but not limited to nutritional deficiencies, liver diseases, autoimmune diseases, bone marrow disorders, viral infections and some foreign devices in the body which can lead to thrombocytopenia.  We have agreed to work-up and return to clinic for telehealth visit.  He is here for video visit, still doing very well, no complaints. Labs from recent visit did not show any evidence of nutritional deficiency, hemolysis, liver disease, hepatitis.  He does have mildly low platelet count at 117,000 smear confirmed thrombocytopenia. We have discussed 2 options today.  We have discussed about surveillance and follow-up with repeat labs in 3 months versus considering bone marrow aspiration and biopsy.  We have discussed about possible bone marrow disorders which can present with thrombocytopenia.  Since he is clinically asymptomatic and thrombocytopenia is mild, he would like to proceed with surveillance at this time. We have discussed about warning symptoms and signs to reach out to me including petechial rash, B symptoms,, bleeding, epistaxis, or any other new concerns.  He expressed understanding.  I have shown him a picture of petechial rash to pay attention to. He will return to clinic in 3 months with repeat labs.

## 2020-11-05 ENCOUNTER — Ambulatory Visit
Admission: RE | Admit: 2020-11-05 | Discharge: 2020-11-05 | Disposition: A | Discharge status: Home / Self Care | Payer: PRIVATE HEALTH INSURANCE | Source: Ambulatory Visit | Attending: Family Medicine | Admitting: Family Medicine

## 2020-11-05 DIAGNOSIS — F17210 Nicotine dependence, cigarettes, uncomplicated: Secondary | ICD-10-CM

## 2021-01-23 ENCOUNTER — Inpatient Hospital Stay: Payer: 59 | Attending: Hematology and Oncology

## 2021-01-23 ENCOUNTER — Other Ambulatory Visit: Payer: Self-pay

## 2021-01-23 ENCOUNTER — Encounter: Payer: Self-pay | Admitting: Hematology and Oncology

## 2021-01-23 ENCOUNTER — Inpatient Hospital Stay (HOSPITAL_BASED_OUTPATIENT_CLINIC_OR_DEPARTMENT_OTHER): Payer: 59 | Admitting: Hematology and Oncology

## 2021-01-23 DIAGNOSIS — R634 Abnormal weight loss: Secondary | ICD-10-CM | POA: Insufficient documentation

## 2021-01-23 DIAGNOSIS — F17210 Nicotine dependence, cigarettes, uncomplicated: Secondary | ICD-10-CM | POA: Insufficient documentation

## 2021-01-23 DIAGNOSIS — D696 Thrombocytopenia, unspecified: Secondary | ICD-10-CM

## 2021-01-23 LAB — CBC WITH DIFFERENTIAL/PLATELET
Abs Immature Granulocytes: 0.01 10*3/uL (ref 0.00–0.07)
Basophils Absolute: 0 10*3/uL (ref 0.0–0.1)
Basophils Relative: 1 %
Eosinophils Absolute: 0.1 10*3/uL (ref 0.0–0.5)
Eosinophils Relative: 2 %
HCT: 41.4 % (ref 39.0–52.0)
Hemoglobin: 14 g/dL (ref 13.0–17.0)
Immature Granulocytes: 0 %
Lymphocytes Relative: 34 %
Lymphs Abs: 1.5 10*3/uL (ref 0.7–4.0)
MCH: 32.3 pg (ref 26.0–34.0)
MCHC: 33.8 g/dL (ref 30.0–36.0)
MCV: 95.4 fL (ref 80.0–100.0)
Monocytes Absolute: 0.5 10*3/uL (ref 0.1–1.0)
Monocytes Relative: 11 %
Neutro Abs: 2.2 10*3/uL (ref 1.7–7.7)
Neutrophils Relative %: 52 %
Platelets: 89 10*3/uL — ABNORMAL LOW (ref 150–400)
RBC: 4.34 MIL/uL (ref 4.22–5.81)
RDW: 13.1 % (ref 11.5–15.5)
WBC: 4.3 10*3/uL (ref 4.0–10.5)
nRBC: 0 % (ref 0.0–0.2)

## 2021-01-23 NOTE — Progress Notes (Signed)
[facility] CONSULT NOTE  Patient Care Team: Deatra James, MD as PCP - General (Family Medicine)  CHIEF COMPLAINTS/PURPOSE OF CONSULTATION:  Thrombocytopenia.  ASSESSMENT & PLAN:   This is a very pleasant 60 year old male patient who was initially referred to [place] for evaluation of thrombocytopenia here for follow-up.  Since last visit, he denies any major complaints although he brings up the weight loss which has been ongoing for the past 6 months to a year.  He mentions that he may have lost about 18 pounds or so in the past 6 months to a year.  He did not remember to mention this during his last visit. Besides weight loss, he also describes some increased sensitivity in his abdomen, colonoscopy scheduled for next month. He had his lung cancer CT screening which was negative. Physical examination quite unremarkable today. Have reviewed his labs which showed worsening thrombocytopenia.  Given unexplained weight loss, some abdominal symptoms and worsening thrombocytopenia, I have discussed about proceeding with bone marrow aspiration and biopsy to rule out any primary myeloproliferative disorders as well as a CT abdomen pelvis. Thank you for consulting [place] in the care of this patient.  Please do not hesitate to contact us with any additional questions or concerns. I have encouraged smoking cessation.  HISTORY OF PRESENTING ILLNESS:   Michael James 60 y.o. male is here because of thrombocytopenia.  This is a very pleasant 60 year old male patient with no significant past medical history referred to hematology for evaluation of thrombocytopenia.  Michael James is a very healthy male patient with no significant past medical history who was found to have thrombocytopenia since 2021.    During the last visit, we have discussed about surveillance versus bone marrow aspiration and biopsy.  Since he was clinically asymptomatic he wanted to wait on the bone marrow aspiration and  biopsy.  Interval History  He is here for follow-up today.  He today brings up weight loss of about 18 to 20 pounds in the past 6 months to a year.  He attributes some of this to stress and lack of appetite.  Besides weight loss, he also brings up some increased sensitivity in the abdomen, he does not quite describe it as abdominal pain.  He denies any change in bowel habits.  He has his colonoscopy scheduled for next week.  He had lung cancer screening which did not show any evidence of active lung malignancy. He for the most part is healthy.  He says his wife is really worried about his low platelet count as well as ongoing weight loss. Rest of the pertinent 10 point ROS reviewed and negative.  MEDICAL HISTORY:  Past Medical History:  Diagnosis Date   Left-sided chest wall pain     SURGICAL HISTORY: Past Surgical History:  Procedure Laterality Date   TONSILLECTOMY     VASECTOMY      SOCIAL HISTORY: Social History   Socioeconomic History   Marital status: Married    Spouse name: Not on file   Number of children: 2   Years of education: Not on file   Highest education level: Not on file  Occupational History    Employer: [employer]  Tobacco Use   Smoking status: Every Day    Packs/day: 1.50    Years: 35.00    Pack years: 52.50    Types: Cigarettes   Smokeless tobacco: Never  Substance and Sexual Activity   Alcohol use: Yes   Drug use: Not on  file   Sexual activity: Not on file  Other Topics Concern   Not on file  Social History Narrative   Not on file   Social Determinants of Health   Financial Resource Strain: Not on file  Food Insecurity: Not on file  Transportation Needs: Not on file  Physical Activity: Not on file  Stress: Not on file  Social Connections: Not on file  Intimate Partner Violence: Not on file    FAMILY HISTORY: Family History  Problem Relation Age of Onset   Sudden death Mother        Undetermined.  No CAD   Heart failure  Maternal Grandmother 63    ALLERGIES:  has No Known Allergies.  MEDICATIONS:  Current Outpatient Medications  Medication Sig Dispense Refill   acetaminophen (TYLENOL) 325 MG tablet Take 650 mg by mouth every 6 (six) hours as needed for mild pain, fever or headache.     Chlorpheniramine Maleate (ALLERGY PO) Take 1 tablet by mouth daily as needed (allergies).     ibuprofen (ADVIL) 200 MG tablet Take 200 mg by mouth every 6 (six) hours as needed for fever or moderate pain.     No current facility-administered medications for this visit.   PHYSICAL EXAMINATION:  ECOG PERFORMANCE STATUS: 0 - Asymptomatic BP (!) 141/85 (BP Location: Left Arm, Patient Position: Sitting)   Pulse 69   Temp (!) 97.3 F (36.3 C) (Oral)   Resp 18   Wt 170 lb 7 oz (77.3 kg)   SpO2 100%   BMI 22.49 kg/m   Physical Exam Constitutional:      Appearance: Normal appearance.  HENT:     Head: Normocephalic and atraumatic.  Cardiovascular:     Rate and Rhythm: Normal rate and regular rhythm.     Pulses: Normal pulses.     Heart sounds: Normal heart sounds.  Pulmonary:     Effort: Pulmonary effort is normal.     Breath sounds: Normal breath sounds.  Abdominal:     General: Abdomen is flat. Bowel sounds are normal.     Palpations: Abdomen is soft.  Musculoskeletal:        General: No swelling or tenderness.     Cervical back: Normal range of motion and neck supple. No rigidity.  Lymphadenopathy:     Cervical: No cervical adenopathy.  Skin:    General: Skin is warm and dry.  Neurological:     General: No focal deficit present.     Mental Status: He is alert.  Psychiatric:        Mood and Affect: Mood normal.     LABORATORY DATA:  I have reviewed the data as listed Lab Results  Component Value Date   WBC 4.3 01/23/2021   HGB 14.0 01/23/2021   HCT 41.4 01/23/2021   MCV 95.4 01/23/2021   PLT 89 (L) 01/23/2021     Chemistry      Component Value Date/Time   NA 139 10/08/2020 1114   K 4.5  10/08/2020 1114   CL 105 10/08/2020 1114   CO2 26 10/08/2020 1114   BUN 13 10/08/2020 1114   CREATININE 1.16 10/08/2020 1114      Component Value Date/Time   CALCIUM 9.5 10/08/2020 1114   ALKPHOS 65 10/08/2020 1114   AST 22 10/08/2020 1114   ALT 17 10/08/2020 1114   BILITOT 0.4 10/08/2020 1114      RADIOGRAPHIC STUDIES: I have personally reviewed the radiological images as listed and agreed with the findings  in the report. I have reviewed all the labs, no concerns, only mild thrombocytopenia. Spent 30 minutes in the care of this patient including history and physical, review of records, counseling and coordination of care.  We have discussed about proceeding with bone marrow aspiration and biopsy, CT abdomen pelvis given unexplained weight loss and return to clinic for follow-up in a couple weeks to review results and to discuss any additional recommendations.     Rachel Moulds, MD 01/23/2021 9:54 AM

## 2021-01-23 NOTE — Assessment & Plan Note (Signed)
This is a very pleasant 60 year old male patient with no significant past medical history who was referred to hematology for evaluation of thrombocytopenia.  During his initial visit, he denied any complaints and no concerning physical examination findings.  We have discussed about common causes of thrombocytopenia including but not limited to nutritional deficiencies, liver diseases, autoimmune diseases, bone marrow disorders, viral infections and some foreign devices in the body which can lead to thrombocytopenia.  We have agreed to work-up and return to clinic for telehealth visit.  He is here for video visit, still doing very well, no complaints. Labs from initial visit did not show any evidence of nutritional deficiency, hemolysis, liver disease, hepatitis.  He does have mildly low platelet count at 117,000 smear confirmed thrombocytopenia. We have discussed 2 options today.  We have discussed about surveillance and follow-up with repeat labs in 3 months versus considering bone marrow aspiration and biopsy.  We have discussed about possible bone marrow disorders which can present with thrombocytopenia.  Since he is clinically asymptomatic and thrombocytopenia is mild, he would like to proceed with surveillance at this time.

## 2021-02-03 ENCOUNTER — Other Ambulatory Visit: Payer: Self-pay

## 2021-02-03 ENCOUNTER — Ambulatory Visit (HOSPITAL_COMMUNITY)
Admission: RE | Admit: 2021-02-03 | Discharge: 2021-02-03 | Disposition: A | Discharge status: Home / Self Care | Payer: 59 | Source: Ambulatory Visit | Attending: Hematology and Oncology | Admitting: Hematology and Oncology

## 2021-02-03 DIAGNOSIS — D696 Thrombocytopenia, unspecified: Secondary | ICD-10-CM | POA: Insufficient documentation

## 2021-02-03 MED ORDER — IOHEXOL 350 MG/ML SOLN
80.0000 mL | Freq: Once | INTRAVENOUS | Status: AC | PRN
  Administered 2021-02-03: 80 mL via INTRAVENOUS

## 2021-02-13 ENCOUNTER — Ambulatory Visit: Payer: 59 | Admitting: Hematology and Oncology

## 2021-02-24 ENCOUNTER — Other Ambulatory Visit: Payer: Self-pay | Admitting: Radiology

## 2021-02-25 ENCOUNTER — Other Ambulatory Visit: Payer: Self-pay

## 2021-02-25 ENCOUNTER — Encounter (HOSPITAL_COMMUNITY): Payer: Self-pay

## 2021-02-25 ENCOUNTER — Ambulatory Visit (HOSPITAL_COMMUNITY)
Admission: RE | Admit: 2021-02-25 | Discharge: 2021-02-25 | Disposition: A | Discharge status: Home / Self Care | Payer: 59 | Source: Ambulatory Visit | Attending: Hematology and Oncology | Admitting: Hematology and Oncology

## 2021-02-25 DIAGNOSIS — F17210 Nicotine dependence, cigarettes, uncomplicated: Secondary | ICD-10-CM | POA: Diagnosis not present

## 2021-02-25 DIAGNOSIS — D696 Thrombocytopenia, unspecified: Secondary | ICD-10-CM | POA: Insufficient documentation

## 2021-02-25 LAB — CBC WITH DIFFERENTIAL/PLATELET
Abs Immature Granulocytes: 0.01 10*3/uL (ref 0.00–0.07)
Basophils Absolute: 0 10*3/uL (ref 0.0–0.1)
Basophils Relative: 0 %
Eosinophils Absolute: 0.1 10*3/uL (ref 0.0–0.5)
Eosinophils Relative: 1 %
HCT: 42 % (ref 39.0–52.0)
Hemoglobin: 14.6 g/dL (ref 13.0–17.0)
Immature Granulocytes: 0 %
Lymphocytes Relative: 26 %
Lymphs Abs: 1.5 10*3/uL (ref 0.7–4.0)
MCH: 33.6 pg (ref 26.0–34.0)
MCHC: 34.8 g/dL (ref 30.0–36.0)
MCV: 96.6 fL (ref 80.0–100.0)
Monocytes Absolute: 0.4 10*3/uL (ref 0.1–1.0)
Monocytes Relative: 6 %
Neutro Abs: 3.7 10*3/uL (ref 1.7–7.7)
Neutrophils Relative %: 67 %
Platelets: 121 10*3/uL — ABNORMAL LOW (ref 150–400)
RBC: 4.35 MIL/uL (ref 4.22–5.81)
RDW: 13.1 % (ref 11.5–15.5)
WBC: 5.6 10*3/uL (ref 4.0–10.5)
nRBC: 0 % (ref 0.0–0.2)

## 2021-02-25 MED ORDER — FENTANYL CITRATE (PF) 100 MCG/2ML IJ SOLN
INTRAMUSCULAR | Status: DC | PRN
  Administered 2021-02-25: 50 ug via INTRAVENOUS

## 2021-02-25 MED ORDER — FENTANYL CITRATE (PF) 100 MCG/2ML IJ SOLN
INTRAMUSCULAR | Status: AC
  Filled 2021-02-25: qty 2

## 2021-02-25 MED ORDER — SODIUM CHLORIDE 0.9 % IV SOLN: INTRAVENOUS | Status: DC

## 2021-02-25 MED ORDER — ONDANSETRON HCL 4 MG/2ML IJ SOLN
4.0000 mg | Freq: Once | INTRAMUSCULAR | Status: AC
  Administered 2021-02-25: 4 mg via INTRAVENOUS
  Filled 2021-02-25: qty 2

## 2021-02-25 NOTE — Procedures (Signed)
Vascular and Interventional Radiology Procedure Note  Patient: Michael James DOB: Apr 10, 1961 Medical Record Number: [id number] Note Date/Time: 02/25/21 12:17 PM   Performing Physician: Michaelle Birks, MD Assistant(s): None  Diagnosis: Thrombocytopenia  Procedure: BONE MARROW ASPIRATION AND BIOPSY  Anesthesia: Conscious Sedation Complications: None Estimated Blood Loss:  0 mL Specimens: Sent for Pathology  Findings:  Successful CT-guided bone marrow biopsy A total of 1 cores were obtained. Hemostasis of the tract was achieved using Manual Pressure.  Plan: Bed rest for 1 hours.  See detailed procedure note with images in PACS. The patient tolerated the procedure well without incident or complication and was returned to Recovery in stable condition.    Michaelle Birks, MD [facility] Interventional [facility] Radiology   Pager. [place]

## 2021-02-25 NOTE — Consult Note (Signed)
Chief Complaint: Patient was seen in consultation today for CT-guided bone marrow biopsy  Referring Physician(s): Iruku,Praveena  Supervising Physician: Michaelle Birks  Patient Status: [facility] - Out-pt  History of Present Illness: Michael James is a 60 y.o. male smoker with history of abdominal/back discomfort, weight loss, and worsening thrombocytopenia of uncertain etiology who presents today for CT-guided bone marrow biopsy for further evaluation/rule out any primary myeloproliferative disorder.  Past Medical History:  Diagnosis Date   Left-sided chest wall pain     Past Surgical History:  Procedure Laterality Date   TONSILLECTOMY     VASECTOMY      Allergies: Patient has no known allergies.  Medications: Prior to Admission medications   Medication Sig Start Date End Date Taking? Authorizing Provider  acetaminophen (TYLENOL) 325 MG tablet Take 650 mg by mouth every 6 (six) hours as needed for mild pain, fever or headache.    [provider]  Chlorpheniramine Maleate (ALLERGY PO) Take 1 tablet by mouth daily as needed (allergies).    [provider]  ibuprofen (ADVIL) 200 MG tablet Take 200 mg by mouth every 6 (six) hours as needed for fever or moderate pain.    [provider]     Family History  Problem Relation Age of Onset   Sudden death Mother        Undetermined.  No CAD   Heart failure Maternal Grandmother 63    Social History   Socioeconomic History   Marital status: Married    Spouse name: Not on file   Number of children: 2   Years of education: Not on file   Highest education level: Not on file  Occupational History    Employer: [employer]  Tobacco Use   Smoking status: Every Day    Packs/day: 1.50    Years: 35.00    Pack years: 52.50    Types: Cigarettes   Smokeless tobacco: Never  Substance and Sexual Activity   Alcohol use: Yes   Drug use: Not on file   Sexual activity: Not on file  Other Topics  Concern   Not on file  Social History Narrative   Not on file   Social Determinants of Health   Financial Resource Strain: Not on file  Food Insecurity: Not on file  Transportation Needs: Not on file  Physical Activity: Not on file  Stress: Not on file  Social Connections: Not on file      Review of Systems see above ;currently denies fever, headache, chest pain, nausea, vomiting or bleeding  Vital Signs:pending   Physical Exam awake, alert.  Chest with distant breath sounds bilaterally.  Heart with regular rate and rhythm.  Abdomen soft, positive bowel sounds, nontender.  No lower extremity edema.  Imaging: CT Abdomen Pelvis W Contrast  Result Date: 02/03/2021 CLINICAL DATA:  Unintended weight loss, abdominal pain, thrombocytopenia, smoker EXAM: CT ABDOMEN AND PELVIS WITH CONTRAST TECHNIQUE: Multidetector CT imaging of the abdomen and pelvis was performed using the standard protocol following bolus administration of intravenous contrast. Sagittal and coronal MPR images reconstructed from axial data set. CONTRAST:  67m OMNIPAQUE IOHEXOL 350 MG/ML SOLN IV. Dilute oral contrast. COMPARISON:  None FINDINGS: Lower chest: Dependent atelectasis at lung bases Hepatobiliary: Probable small hepatic cysts measuring 9 mm and 10 mm. Gallbladder and liver otherwise normal appearance Pancreas: Normal appearance Spleen: Normal appearance.  Small adjacent splenule. Adrenals/Urinary Tract: Small LEFT adrenal adenoma 17 x 14 mm. RIGHT renal cyst posterior upper pole 19  x 14 mm. Kidneys, ureters, and bladder otherwise normal appearance Stomach/Bowel: Normal appendix. Stomach and bowel loops normal appearance Vascular/Lymphatic: Atherosclerotic calcifications aorta, iliac arteries, femoral arteries. Aorta normal caliber without aneurysm. Heart unremarkable. No adenopathy. Reproductive: Minimal prostatic enlargement. Other: No free air or free fluid. No hernia or inflammatory process. Musculoskeletal:  Unremarkable IMPRESSION: Small LEFT adrenal adenoma 17 x 14 mm. Small RIGHT renal and hepatic cysts. No acute intra-abdominal or intrapelvic abnormalities. Aortic Atherosclerosis (ICD10-I70.0). Electronically Signed   By: Lavonia Dana M.D.   On: 02/03/2021 08:42    Labs:  CBC: Recent Labs    10/08/20 1114 10/08/20 1115 01/23/21 0904  WBC 6.5  --  4.3  HGB 15.3  --  14.0  HCT 43.2 45.4 41.4  PLT 117*  --  89*    COAGS: No results for input(s): INR, APTT in the last 8760 hours.  BMP: Recent Labs    10/08/20 1114  NA 139  K 4.5  CL 105  CO2 26  GLUCOSE 92  BUN 13  CALCIUM 9.5  CREATININE 1.16  GFRNONAA >60    LIVER FUNCTION TESTS: Recent Labs    10/08/20 1114  BILITOT 0.4  AST 22  ALT 17  ALKPHOS 65  PROT 7.1  ALBUMIN 4.0    TUMOR MARKERS: No results for input(s): AFPTM, CEA, CA199, CHROMGRNA in the last 8760 hours.  Assessment and Plan: 60 y.o. male smoker with history of abdominal/back discomfort, weight loss, and worsening thrombocytopenia of uncertain etiology who presents today for CT-guided bone marrow biopsy for further evaluation/rule out any primary myeloproliferative disorder.Risks and benefits of procedure was discussed with the patient  including, but not limited to bleeding, infection, damage to adjacent structures or low yield requiring additional tests.  All of the questions were answered and there is agreement to proceed.  Consent signed and in chart.    Thank you for this interesting consult.  I greatly enjoyed meeting Michael James and look forward to participating in their care.  A copy of this report was sent to the requesting provider on this date.  Electronically Signed: D. Rowe Robert, PA-C 02/25/2021, 9:28 AM   I spent a total of   20 minutes  in face to face in clinical consultation, greater than 50% of which was counseling/coordinating care for CT-guided bone marrow biopsy

## 2021-02-25 NOTE — Progress Notes (Signed)
Pt complained of nausea at 1250 then vomited at 1255. Fluids increased to completely open and Jeananne Rama PA paged whom ordered Zofran. Order completed. Pt had no complaints upon discharge.

## 2021-02-26 ENCOUNTER — Telehealth: Payer: Self-pay | Admitting: Hematology and Oncology

## 2021-02-26 NOTE — Telephone Encounter (Signed)
Called patient regarding upcoming appointments, left a voicemail. 

## 2021-03-04 ENCOUNTER — Ambulatory Visit: Payer: 59 | Admitting: Hematology and Oncology

## 2021-03-05 ENCOUNTER — Other Ambulatory Visit: Payer: Self-pay | Admitting: Hematology and Oncology

## 2021-03-05 ENCOUNTER — Inpatient Hospital Stay: Payer: 59

## 2021-03-05 ENCOUNTER — Inpatient Hospital Stay: Payer: 59 | Attending: Hematology and Oncology | Admitting: Hematology and Oncology

## 2021-03-05 ENCOUNTER — Encounter (HOSPITAL_COMMUNITY): Payer: Self-pay | Admitting: Hematology and Oncology

## 2021-03-05 ENCOUNTER — Other Ambulatory Visit: Payer: Self-pay

## 2021-03-05 VITALS — BP 119/89 | HR 71 | Temp 97.5°F | Resp 18 | Ht 72.0 in | Wt 169.9 lb

## 2021-03-05 DIAGNOSIS — F17210 Nicotine dependence, cigarettes, uncomplicated: Secondary | ICD-10-CM | POA: Diagnosis not present

## 2021-03-05 DIAGNOSIS — D696 Thrombocytopenia, unspecified: Secondary | ICD-10-CM

## 2021-03-05 DIAGNOSIS — F1290 Cannabis use, unspecified, uncomplicated: Secondary | ICD-10-CM | POA: Diagnosis not present

## 2021-03-05 LAB — CBC WITH DIFFERENTIAL (CANCER CENTER ONLY)
Abs Immature Granulocytes: 0.03 10*3/uL (ref 0.00–0.07)
Basophils Absolute: 0 10*3/uL (ref 0.0–0.1)
Basophils Relative: 0 %
Eosinophils Absolute: 0.2 10*3/uL (ref 0.0–0.5)
Eosinophils Relative: 2 %
HCT: 42.3 % (ref 39.0–52.0)
Hemoglobin: 14.3 g/dL (ref 13.0–17.0)
Immature Granulocytes: 0 %
Lymphocytes Relative: 31 %
Lymphs Abs: 2.6 10*3/uL (ref 0.7–4.0)
MCH: 32.5 pg (ref 26.0–34.0)
MCHC: 33.8 g/dL (ref 30.0–36.0)
MCV: 96.1 fL (ref 80.0–100.0)
Monocytes Absolute: 0.4 10*3/uL (ref 0.1–1.0)
Monocytes Relative: 5 %
Neutro Abs: 5.1 10*3/uL (ref 1.7–7.7)
Neutrophils Relative %: 62 %
Platelet Count: 137 10*3/uL — ABNORMAL LOW (ref 150–400)
RBC: 4.4 MIL/uL (ref 4.22–5.81)
RDW: 13.2 % (ref 11.5–15.5)
WBC Count: 8.3 10*3/uL (ref 4.0–10.5)
nRBC: 0 % (ref 0.0–0.2)

## 2021-03-05 LAB — CMP (CANCER CENTER ONLY)
ALT: 22 U/L (ref 0–44)
AST: 29 U/L (ref 15–41)
Albumin: 4.2 g/dL (ref 3.5–5.0)
Alkaline Phosphatase: 66 U/L (ref 38–126)
Anion gap: 10 (ref 5–15)
BUN: 14 mg/dL (ref 6–20)
CO2: 25 mmol/L (ref 22–32)
Calcium: 9.6 mg/dL (ref 8.9–10.3)
Chloride: 104 mmol/L (ref 98–111)
Creatinine: 1.02 mg/dL (ref 0.61–1.24)
GFR, Estimated: >60 mL/min (ref >60)
Glucose, Bld: 91 mg/dL (ref 70–99)
Potassium: 4.3 mmol/L (ref 3.5–5.1)
Sodium: 139 mmol/L (ref 135–145)
Total Bilirubin: 0.4 mg/dL (ref 0.3–1.2)
Total Protein: 7.1 g/dL (ref 6.5–8.1)

## 2021-03-05 LAB — HIV ANTIBODY (ROUTINE TESTING W REFLEX): HIV Screen 4th Generation wRfx: NONREACTIVE

## 2021-03-05 LAB — IMMATURE PLATELET FRACTION: Immature Platelet Fraction: 4.3 % (ref 1.2–8.6)

## 2021-03-05 LAB — LACTATE DEHYDROGENASE: LDH: 183 U/L (ref 98–192)

## 2021-03-05 NOTE — Progress Notes (Signed)
[facility] [phone]   [phone]  PROGRESS NOTE  Patient Care Team: Sun, Vyvyan, MD as PCP - General (Family Medicine)  Hematological/Oncological History # Thrombocytopenia 07/29/2019: WBC 9.5, Hgb 13.4, MCV 96.4, Plt 135 10/08/2020: WBC 6.5, Hgb 15.3, MCV 91.9, Plt 117 01/23/2021: WBC 4.3, Hgb 14.0, MCV 95.4, Plt 89 02/03/2021: CT A/P shows no evidence of splenomegaly or liver disease.  02/25/2021: WBC 5.6, Hgb 14.6, MCV 42, Plt 121. Bone marrow biopsy performed, no evidence of a bone marrow disorder.   Interval History:  Michael James 59 y.o. male with medical history significant for thrombocytopenia of unclear etiology who presents for a follow up visit. The patient's last visit was on 01/23/2021. In the interim since the last visit he underwent a bone marrow biopsy and CT abdomen pelvis.   On exam today Mr. Livermore notes that he has been well in exam since his last visit with Dr. Iruku.  He reports that he has not been having any issues with bleeding, bruising, or dark stools.  When he does not nicked his skin he reports that he clots very quickly.  He notes that his bone marrow biopsy procedure went well and he is anxious to hear the results of the test.  Additionally he has had no other major changes in his health since our last visit.  He denies any fevers, chills, sweats, nausea, vomiting or diarrhea.  A full 10 point ROS is listed below.  MEDICAL HISTORY:  Past Medical History:  Diagnosis Date   Left-sided chest wall pain     SURGICAL HISTORY: Past Surgical History:  Procedure Laterality Date   TONSILLECTOMY     VASECTOMY      SOCIAL HISTORY: Social History   Socioeconomic History   Marital status: Married    Spouse name: Not on file   Number of children: 2   Years of education: Not on file   Highest education level: Not on file  Occupational History    Employer: [employer]  Tobacco Use   Smoking status: Every Day     Packs/day: 1.50    Years: 35.00    Pack years: 52.50    Types: Cigarettes   Smokeless tobacco: Never  Vaping Use   Vaping Use: Never used  Substance and Sexual Activity   Alcohol use: Yes   Drug use: Yes    Types: Marijuana    Comment: current use last was this past weekend   Sexual activity: Not on file  Other Topics Concern   Not on file  Social History Narrative   Not on file   Social Determinants of Health   Financial Resource Strain: Not on file  Food Insecurity: Not on file  Transportation Needs: Not on file  Physical Activity: Not on file  Stress: Not on file  Social Connections: Not on file  Intimate Partner Violence: Not on file    FAMILY HISTORY: Family History  Problem Relation Age of Onset   Sudden death Mother        Undetermined.  No CAD   Heart failure Maternal Grandmother 63    ALLERGIES:  has No Known Allergies.  MEDICATIONS:  Current Outpatient Medications  Medication Sig Dispense Refill   acetaminophen (TYLENOL) 325 MG tablet Take 650 mg by mouth every 6 (six) hours as needed for mild pain, fever or headache.     Chlorpheniramine Maleate (ALLERGY PO) Take 1 tablet by mouth daily as needed (allergies).       No current facility-administered medications for this visit.    REVIEW OF SYSTEMS:   Constitutional: ( - ) fevers, ( - )  chills , ( - ) night sweats Eyes: ( - ) blurriness of vision, ( - ) double vision, ( - ) watery eyes Ears, nose, mouth, throat, and face: ( - ) mucositis, ( - ) sore throat Respiratory: ( - ) cough, ( - ) dyspnea, ( - ) wheezes Cardiovascular: ( - ) palpitation, ( - ) chest discomfort, ( - ) lower extremity swelling Gastrointestinal:  ( - ) nausea, ( - ) heartburn, ( - ) change in bowel habits Skin: ( - ) abnormal skin rashes Lymphatics: ( - ) new lymphadenopathy, ( - ) easy bruising Neurological: ( - ) numbness, ( - ) tingling, ( - ) new weaknesses Behavioral/Psych: ( - ) mood change, ( - ) new changes  All other  systems were reviewed with the patient and are negative.  PHYSICAL EXAMINATION:  Vitals:   03/05/21 1419  BP: 119/89  Pulse: 71  Resp: 18  Temp: (!) 97.5 F (36.4 C)  SpO2: 100%   Filed Weights   03/05/21 1419  Weight: 169 lb 14.4 oz (77.1 kg)    GENERAL: well appearing middle aged Caucasian male. alert, no distress and comfortable SKIN: skin color, texture, turgor are normal, no rashes or significant lesions EYES: conjunctiva are pink and non-injected, sclera clear LUNGS: clear to auscultation and percussion with normal breathing effort HEART: regular rate & rhythm and no murmurs and no lower extremity edema Musculoskeletal: no cyanosis of digits and no clubbing  PSYCH: alert & oriented x 3, fluent speech NEURO: no focal motor/sensory deficits  LABORATORY DATA:  I have reviewed the data as listed CBC Latest Ref Rng & Units 03/05/2021 03/06/21 01/23/2021  WBC 4.0 - 10.5 K/uL 8.3 5.6 4.3  Hemoglobin 13.0 - 17.0 g/dL 14.3 14.6 14.0  Hematocrit 39.0 - 52.0 % 42.3 42.0 41.4  Platelets 150 - 400 K/uL 137(L) 121(L) 89(L)    CMP Latest Ref Rng & Units 03/05/2021 10/08/2020 07/29/2019  Glucose 70 - 99 mg/dL 91 92 111(H)  BUN 6 - 20 mg/dL _0 Creatinine 0.61 - 1.24 mg/dL 1.02 1.16 1.23  Sodium 135 - 145 mmol/L 139 139 136  Potassium 3.5 - 5.1 mmol/L 4.3 4.5 3.6  Chloride 98 - 111 mmol/L 104 105 105  CO2 22 - 32 mmol/L 25 26 21(L)  Calcium 8.9 - 10.3 mg/dL 9.6 9.5 8.6(L)  Total Protein 6.5 - 8.1 g/dL 7.1 7.1 6.4(L)  Total Bilirubin 0.3 - 1.2 mg/dL 0.4 0.4 0.6  Alkaline Phos 38 - 126 U/L 66 65 67  AST 15 - 41 U/L _1 ALT 0 - 44 U/L _2 RADIOGRAPHIC STUDIES: CT BIOPSY  Result Date: 03-06-2021 INDICATION: Thrombocytopenia. EXAM: CT GUIDED BONE MARROW ASPIRATION AND CORE BIOPSY MEDICATIONS: None. ANESTHESIA/SEDATION: Single agent sedation, with 100 mcg Fentanyl, was administered. FLUOROSCOPY TIME:  CT dose was not reported. COMPLICATIONS: None immediate.  Estimated blood loss: <5 mL PROCEDURE: Informed written consent was obtained from the patient after a thorough discussion of the procedural risks, benefits and alternatives. All questions were addressed. Maximal Sterile Barrier Technique was utilized including caps, mask, sterile gowns, sterile gloves, sterile drape, hand hygiene and skin antiseptic. A timeout was performed prior to the initiation of the procedure. The patient was positioned prone and non-contrast localization CT was performed of the pelvis to demonstrate the iliac  marrow spaces. Maximal barrier sterile technique utilized including caps, mask, sterile gowns, sterile gloves, large sterile drape, hand hygiene, and chlorhexidine prep. Under sterile conditions and local anesthesia, an 11 gauge coaxial bone biopsy needle was advanced into the RIGHT iliac marrow space. Needle position was confirmed with CT imaging. Initially, bone marrow aspiration was performed. Next, the 11 gauge outer cannula was utilized to obtain a 1 iliac bone marrow core biopsy. Needle was removed. Hemostasis was obtained with compression. The patient tolerated the procedure well. Samples were prepared with the cytotechnologist. IMPRESSION: Successful CT-guided bone marrow aspiration and biopsy, as above. Jon Mugweru, MD [facility] Electronically Signed   By: Jon  Mugweru M.D.   On: 02/25/2021 12:22   CT BONE MARROW BIOPSY & ASPIRATION  Result Date: 02/25/2021 INDICATION: Thrombocytopenia. EXAM: CT GUIDED BONE MARROW ASPIRATION AND CORE BIOPSY MEDICATIONS: None. ANESTHESIA/SEDATION: Single agent sedation, with 100 mcg Fentanyl, was administered. FLUOROSCOPY TIME:  CT dose was not reported. COMPLICATIONS: None immediate. Estimated blood loss: <5 mL PROCEDURE: Informed written consent was obtained from the patient after a thorough discussion of the procedural risks, benefits and alternatives. All questions were  addressed. Maximal Sterile Barrier Technique was utilized including caps, mask, sterile gowns, sterile gloves, sterile drape, hand hygiene and skin antiseptic. A timeout was performed prior to the initiation of the procedure. The patient was positioned prone and non-contrast localization CT was performed of the pelvis to demonstrate the iliac marrow spaces. Maximal barrier sterile technique utilized including caps, mask, sterile gowns, sterile gloves, large sterile drape, hand hygiene, and chlorhexidine prep. Under sterile conditions and local anesthesia, an 11 gauge coaxial bone biopsy needle was advanced into the RIGHT iliac marrow space. Needle position was confirmed with CT imaging. Initially, bone marrow aspiration was performed. Next, the 11 gauge outer cannula was utilized to obtain a 1 iliac bone marrow core biopsy. Needle was removed. Hemostasis was obtained with compression. The patient tolerated the procedure well. Samples were prepared with the cytotechnologist. IMPRESSION: Successful CT-guided bone marrow aspiration and biopsy, as above. Jon Mugweru, MD [facility] Electronically Signed   By: Jon  Mugweru M.D.   On: 02/25/2021 12:22    ASSESSMENT & PLAN Michael James 59 y.o. male with medical history significant for thrombocytopenia of unclear etiology who presents for a follow up visit.   # Thrombocytopenia of Unclear Etiology -- At this time favor a diagnosis of ITP --Would recommend having the patient return to clinic in 3 months time in order to reevaluate.  If platelets are stable could reduce this down to visits every 6 months --No clear etiology noted on the patient's bone marrow biopsy and nutritional studies.  Additionally a CT of the abdomen pelvis did not reveal any splenomegaly or liver disease --If platelets were to drop less than 30 would consider a steroid pulse --Return to clinic in 3 months time to  reevaluate  Orders Placed This Encounter  Procedures   CMP (Cancer Center only)    Standing Status:   Future    Number of Occurrences:   1    Standing Expiration Date:   03/05/2022   HIV antibody (with reflex)    Standing Status:   Future    Number of Occurrences:   1    Standing Expiration Date:   03/05/2022   Immature Platelet Fraction    Standing Status:   Future    Number of Occurrences:   1      Standing Expiration Date:   03/05/2022    All questions were answered. The patient knows to call the clinic with any problems, questions or concerns.  A total of more than 60 minutes were spent on this encounter with face-to-face time and non-face-to-face time, including preparing to see the patient, ordering tests and/or medications, counseling the patient and coordination of care as outlined above.   John T. Dorsey, MD Department of Hematology/Oncology [facility] Phone: [phone] Pager: [phone] Email: [email or web address]  03/05/2021 4:12 PM  

## 2021-03-06 LAB — CEA (IN HOUSE-CHCC): CEA (CHCC-In House): 4.89 ng/mL (ref 0.00–5.00)

## 2021-03-10 LAB — SURGICAL PATHOLOGY

## 2021-06-04 ENCOUNTER — Inpatient Hospital Stay (HOSPITAL_BASED_OUTPATIENT_CLINIC_OR_DEPARTMENT_OTHER): Payer: BC Managed Care – PPO | Admitting: Hematology and Oncology

## 2021-06-04 ENCOUNTER — Other Ambulatory Visit: Payer: Self-pay

## 2021-06-04 ENCOUNTER — Other Ambulatory Visit: Payer: Self-pay | Admitting: Hematology and Oncology

## 2021-06-04 ENCOUNTER — Inpatient Hospital Stay: Payer: BC Managed Care – PPO | Attending: Hematology and Oncology

## 2021-06-04 VITALS — BP 133/90 | HR 68 | Temp 97.8°F | Resp 17 | Wt 174.0 lb

## 2021-06-04 DIAGNOSIS — F17210 Nicotine dependence, cigarettes, uncomplicated: Secondary | ICD-10-CM | POA: Insufficient documentation

## 2021-06-04 DIAGNOSIS — D696 Thrombocytopenia, unspecified: Secondary | ICD-10-CM | POA: Diagnosis not present

## 2021-06-04 LAB — CBC WITH DIFFERENTIAL (CANCER CENTER ONLY)
Abs Immature Granulocytes: 0.01 10*3/uL (ref 0.00–0.07)
Basophils Absolute: 0 10*3/uL (ref 0.0–0.1)
Basophils Relative: 0 %
Eosinophils Absolute: 0.2 10*3/uL (ref 0.0–0.5)
Eosinophils Relative: 3 %
HCT: 45.5 % (ref 39.0–52.0)
Hemoglobin: 15.6 g/dL (ref 13.0–17.0)
Immature Granulocytes: 0 %
Lymphocytes Relative: 28 %
Lymphs Abs: 1.7 10*3/uL (ref 0.7–4.0)
MCH: 32.6 pg (ref 26.0–34.0)
MCHC: 34.3 g/dL (ref 30.0–36.0)
MCV: 95 fL (ref 80.0–100.0)
Monocytes Absolute: 0.4 10*3/uL (ref 0.1–1.0)
Monocytes Relative: 6 %
Neutro Abs: 3.7 10*3/uL (ref 1.7–7.7)
Neutrophils Relative %: 63 %
Platelet Count: 115 10*3/uL — ABNORMAL LOW (ref 150–400)
RBC: 4.79 MIL/uL (ref 4.22–5.81)
RDW: 12.6 % (ref 11.5–15.5)
WBC Count: 6 10*3/uL (ref 4.0–10.5)
nRBC: 0 % (ref 0.0–0.2)

## 2021-06-04 LAB — CMP (CANCER CENTER ONLY)
ALT: 18 U/L (ref 0–44)
AST: 23 U/L (ref 15–41)
Albumin: 4.3 g/dL (ref 3.5–5.0)
Alkaline Phosphatase: 62 U/L (ref 38–126)
Anion gap: 6 (ref 5–15)
BUN: 13 mg/dL (ref 6–20)
CO2: 28 mmol/L (ref 22–32)
Calcium: 9.7 mg/dL (ref 8.9–10.3)
Chloride: 105 mmol/L (ref 98–111)
Creatinine: 1.1 mg/dL (ref 0.61–1.24)
GFR, Estimated: >60 mL/min (ref >60)
Glucose, Bld: 109 mg/dL — ABNORMAL HIGH (ref 70–99)
Potassium: 4.1 mmol/L (ref 3.5–5.1)
Sodium: 139 mmol/L (ref 135–145)
Total Bilirubin: 0.5 mg/dL (ref 0.3–1.2)
Total Protein: 7.3 g/dL (ref 6.5–8.1)

## 2021-06-04 NOTE — Progress Notes (Signed)
[place] [phone]   [phone]  PROGRESS NOTE  Patient Care Team: Donald Prose, MD as PCP - General (Family Medicine)  Hematological/Oncological History # Thrombocytopenia 07/29/2019: WBC 9.5, Hgb 13.4, MCV 96.4, Plt 135 10/08/2020: WBC 6.5, Hgb 15.3, MCV 91.9, Plt 117 01/23/2021: WBC 4.3, Hgb 14.0, MCV 95.4, Plt 89 02/03/2021: CT A/P shows no evidence of splenomegaly or liver disease.  02/25/2021: WBC 5.6, Hgb 14.6, MCV 42, Plt 121. Bone marrow biopsy performed, no evidence of a bone marrow disorder.   Interval History:  [name] 61 y.o. male with medical history significant for thrombocytopenia of unclear etiology who presents for a follow up visit. The patient's last visit was on 03/05/2021. In the interim since the last visit he has had no major changes in his health.  On exam today Mr. Olano notes he has had some modest weight gain in the interim since his last visit.  He reports he has been eating well and is currently up to 174 pounds.  He notes that he has not had any issues with bleeding, bruising, or dark stools.  He notes he is at his baseline level of health.  He denies any fevers, chills, sweats, nausea, vomiting or diarrhea.  A full 10 point ROS is listed below.  MEDICAL HISTORY:  Past Medical History:  Diagnosis Date   Left-sided chest wall pain     SURGICAL HISTORY: Past Surgical History:  Procedure Laterality Date   TONSILLECTOMY     VASECTOMY      SOCIAL HISTORY: Social History   Socioeconomic History   Marital status: Married    Spouse name: Not on file   Number of children: 2   Years of education: Not on file   Highest education level: Not on file  Occupational History    Employer: [employer]  Tobacco Use   Smoking status: Every Day    Packs/day: 1.50    Years: 35.00    Pack years: 52.50    Types: Cigarettes   Smokeless tobacco: Never  Vaping Use   Vaping Use: Never used  Substance and  Sexual Activity   Alcohol use: Yes   Drug use: Yes    Types: Marijuana    Comment: current use last was this past weekend   Sexual activity: Not on file  Other Topics Concern   Not on file  Social History Narrative   Not on file   Social Determinants of Health   Financial Resource Strain: Not on file  Food Insecurity: Not on file  Transportation Needs: Not on file  Physical Activity: Not on file  Stress: Not on file  Social Connections: Not on file  Intimate Partner Violence: Not on file    FAMILY HISTORY: Family History  Problem Relation Age of Onset   Sudden death Mother        Undetermined.  No CAD   Heart failure Maternal Grandmother 63    ALLERGIES:  has No Known Allergies.  MEDICATIONS:  Current Outpatient Medications  Medication Sig Dispense Refill   acetaminophen (TYLENOL) 325 MG tablet Take 650 mg by mouth every 6 (six) hours as needed for mild pain, fever or headache.     Chlorpheniramine Maleate (ALLERGY PO) Take 1 tablet by mouth daily as needed (allergies).     No current facility-administered medications for this visit.    REVIEW OF SYSTEMS:   Constitutional: ( - ) fevers, ( - )  chills , ( - ) night  sweats Eyes: ( - ) blurriness of vision, ( - ) double vision, ( - ) watery eyes Ears, nose, mouth, throat, and face: ( - ) mucositis, ( - ) sore throat Respiratory: ( - ) cough, ( - ) dyspnea, ( - ) wheezes Cardiovascular: ( - ) palpitation, ( - ) chest discomfort, ( - ) lower extremity swelling Gastrointestinal:  ( - ) nausea, ( - ) heartburn, ( - ) change in bowel habits Skin: ( - ) abnormal skin rashes Lymphatics: ( - ) new lymphadenopathy, ( - ) easy bruising Neurological: ( - ) numbness, ( - ) tingling, ( - ) new weaknesses Behavioral/Psych: ( - ) mood change, ( - ) new changes  All other systems were reviewed with the patient and are negative.  PHYSICAL EXAMINATION:  Vitals:   06/04/21 1051  BP: 133/90  Pulse: 68  Resp: 17  Temp: 97.8 F  (36.6 C)  SpO2: 99%   Filed Weights   06/04/21 1051  Weight: 174 lb (78.9 kg)    GENERAL: well appearing middle aged Caucasian male. alert, no distress and comfortable SKIN: skin color, texture, turgor are normal, no rashes or significant lesions EYES: conjunctiva are pink and non-injected, sclera clear LUNGS: clear to auscultation and percussion with normal breathing effort HEART: regular rate & rhythm and no murmurs and no lower extremity edema Musculoskeletal: no cyanosis of digits and no clubbing  PSYCH: alert & oriented x 3, fluent speech NEURO: no focal motor/sensory deficits  LABORATORY DATA:  I have reviewed the data as listed CBC Latest Ref Rng & Units 06/04/2021 03/05/2021 02/25/2021  WBC 4.0 - 10.5 K/uL 6.0 8.3 5.6  Hemoglobin 13.0 - 17.0 g/dL 15.6 14.3 14.6  Hematocrit 39.0 - 52.0 % 45.5 42.3 42.0  Platelets 150 - 400 K/uL 115(L) 137(L) 121(L)    CMP Latest Ref Rng & Units 06/04/2021 03/05/2021 10/08/2020  Glucose 70 - 99 mg/dL 109(H) 91 92  BUN 6 - 20 mg/dL 13 14 13   Creatinine 0.61 - 1.24 mg/dL 1.10 1.02 1.16  Sodium 135 - 145 mmol/L 139 139 139  Potassium 3.5 - 5.1 mmol/L 4.1 4.3 4.5  Chloride 98 - 111 mmol/L 105 104 105  CO2 22 - 32 mmol/L 28 25 26   Calcium 8.9 - 10.3 mg/dL 9.7 9.6 9.5  Total Protein 6.5 - 8.1 g/dL 7.3 7.1 7.1  Total Bilirubin 0.3 - 1.2 mg/dL 0.5 0.4 0.4  Alkaline Phos 38 - 126 U/L 62 66 65  AST 15 - 41 U/L 23 29 22   ALT 0 - 44 U/L 18 22 17    RADIOGRAPHIC STUDIES: No results found.  ASSESSMENT & PLAN PEARL Michael James 61 y.o. male with medical history significant for thrombocytopenia of unclear etiology who presents for a follow up visit.   # Thrombocytopenia of Unclear Etiology -- At this time favor a diagnosis of ITP --Would recommend having the patient return to clinic in 3 months time in order to reevaluate.  If platelets are stable could reduce this down to visits every 6 months --No clear etiology noted on the patient's bone marrow  biopsy and nutritional studies.  Additionally a CT of the abdomen pelvis did not reveal any splenomegaly or liver disease --If platelets were to drop less than 30 would consider a steroid pulse --Return to clinic in 12 months time to reevaluate   No orders of the defined types were placed in this encounter.   All questions were answered. The patient knows to call the  clinic with any problems, questions or concerns.  A total of more than 25 minutes were spent on this encounter with face-to-face time and non-face-to-face time, including preparing to see the patient, ordering tests and/or medications, counseling the patient and coordination of care as outlined above.   Ledell Peoples, MD Department of Hematology/Oncology [place] [facility] Phone: [phone] Pager: [phone] Email: [email or web address]  06/08/2021 4:09 PM

## 2021-06-05 ENCOUNTER — Telehealth: Payer: Self-pay | Admitting: Hematology and Oncology

## 2021-06-05 NOTE — Telephone Encounter (Signed)
Scheduled per 1/25 los, mailed pt calender for appt in one year

## 2022-01-09 DIAGNOSIS — Z0000 Encounter for general adult medical examination without abnormal findings: Secondary | ICD-10-CM | POA: Diagnosis not present

## 2022-01-09 DIAGNOSIS — Z1159 Encounter for screening for other viral diseases: Secondary | ICD-10-CM | POA: Diagnosis not present

## 2022-01-09 DIAGNOSIS — M25551 Pain in right hip: Secondary | ICD-10-CM | POA: Diagnosis not present

## 2022-01-09 DIAGNOSIS — Z125 Encounter for screening for malignant neoplasm of prostate: Secondary | ICD-10-CM | POA: Diagnosis not present

## 2022-01-09 DIAGNOSIS — D171 Benign lipomatous neoplasm of skin and subcutaneous tissue of trunk: Secondary | ICD-10-CM | POA: Diagnosis not present

## 2022-01-09 DIAGNOSIS — J439 Emphysema, unspecified: Secondary | ICD-10-CM | POA: Diagnosis not present

## 2022-01-09 DIAGNOSIS — D693 Immune thrombocytopenic purpura: Secondary | ICD-10-CM | POA: Diagnosis not present

## 2022-01-09 DIAGNOSIS — Z13220 Encounter for screening for lipoid disorders: Secondary | ICD-10-CM | POA: Diagnosis not present

## 2022-02-03 DIAGNOSIS — M25551 Pain in right hip: Secondary | ICD-10-CM | POA: Diagnosis not present

## 2022-02-03 DIAGNOSIS — M25572 Pain in left ankle and joints of left foot: Secondary | ICD-10-CM | POA: Diagnosis not present

## 2022-03-03 DIAGNOSIS — M25572 Pain in left ankle and joints of left foot: Secondary | ICD-10-CM | POA: Diagnosis not present

## 2022-03-03 DIAGNOSIS — M25551 Pain in right hip: Secondary | ICD-10-CM | POA: Diagnosis not present

## 2022-04-20 ENCOUNTER — Telehealth: Payer: Self-pay | Admitting: Hematology and Oncology

## 2022-04-20 NOTE — Telephone Encounter (Signed)
Called patient to r/s January appointment due to provider PAL. Patient notified of new appointment time.  

## 2022-06-05 ENCOUNTER — Ambulatory Visit: Payer: BC Managed Care – PPO | Admitting: Hematology and Oncology

## 2022-06-05 ENCOUNTER — Other Ambulatory Visit: Payer: BC Managed Care – PPO

## 2022-06-09 ENCOUNTER — Other Ambulatory Visit: Payer: Self-pay | Admitting: *Deleted

## 2022-06-09 DIAGNOSIS — D696 Thrombocytopenia, unspecified: Secondary | ICD-10-CM

## 2022-06-10 ENCOUNTER — Other Ambulatory Visit: Payer: Self-pay

## 2022-06-10 ENCOUNTER — Inpatient Hospital Stay (HOSPITAL_BASED_OUTPATIENT_CLINIC_OR_DEPARTMENT_OTHER): Payer: Commercial Managed Care - HMO | Admitting: Hematology and Oncology

## 2022-06-10 ENCOUNTER — Inpatient Hospital Stay: Payer: Commercial Managed Care - HMO | Attending: Nurse Practitioner

## 2022-06-10 VITALS — BP 135/83 | HR 57 | Temp 98.0°F | Resp 14 | Wt 181.6 lb

## 2022-06-10 DIAGNOSIS — D696 Thrombocytopenia, unspecified: Secondary | ICD-10-CM

## 2022-06-10 DIAGNOSIS — F17210 Nicotine dependence, cigarettes, uncomplicated: Secondary | ICD-10-CM | POA: Insufficient documentation

## 2022-06-10 DIAGNOSIS — F1290 Cannabis use, unspecified, uncomplicated: Secondary | ICD-10-CM | POA: Insufficient documentation

## 2022-06-10 LAB — CMP (CANCER CENTER ONLY)
ALT: 11 U/L (ref 0–44)
AST: 17 U/L (ref 15–41)
Albumin: 4 g/dL (ref 3.5–5.0)
Alkaline Phosphatase: 55 U/L (ref 38–126)
Anion gap: 5 (ref 5–15)
BUN: 14 mg/dL (ref 6–20)
CO2: 29 mmol/L (ref 22–32)
Calcium: 9.4 mg/dL (ref 8.9–10.3)
Chloride: 105 mmol/L (ref 98–111)
Creatinine: 1.08 mg/dL (ref 0.61–1.24)
GFR, Estimated: >60 mL/min (ref >60)
Glucose, Bld: 87 mg/dL (ref 70–99)
Potassium: 3.9 mmol/L (ref 3.5–5.1)
Sodium: 139 mmol/L (ref 135–145)
Total Bilirubin: 0.4 mg/dL (ref 0.3–1.2)
Total Protein: 7 g/dL (ref 6.5–8.1)

## 2022-06-10 LAB — CBC WITH DIFFERENTIAL (CANCER CENTER ONLY)
Abs Immature Granulocytes: 0.03 10*3/uL (ref 0.00–0.07)
Basophils Absolute: 0 10*3/uL (ref 0.0–0.1)
Basophils Relative: 0 %
Eosinophils Absolute: 0.2 10*3/uL (ref 0.0–0.5)
Eosinophils Relative: 2 %
HCT: 41.3 % (ref 39.0–52.0)
Hemoglobin: 14.9 g/dL (ref 13.0–17.0)
Immature Granulocytes: 0 %
Lymphocytes Relative: 26 %
Lymphs Abs: 2.1 10*3/uL (ref 0.7–4.0)
MCH: 33.5 pg (ref 26.0–34.0)
MCHC: 36.1 g/dL — ABNORMAL HIGH (ref 30.0–36.0)
MCV: 92.8 fL (ref 80.0–100.0)
Monocytes Absolute: 0.5 10*3/uL (ref 0.1–1.0)
Monocytes Relative: 7 %
Neutro Abs: 5.3 10*3/uL (ref 1.7–7.7)
Neutrophils Relative %: 65 %
Platelet Count: 117 10*3/uL — ABNORMAL LOW (ref 150–400)
RBC: 4.45 MIL/uL (ref 4.22–5.81)
RDW: 12.3 % (ref 11.5–15.5)
WBC Count: 8.1 10*3/uL (ref 4.0–10.5)
nRBC: 0 % (ref 0.0–0.2)

## 2022-06-10 NOTE — Progress Notes (Signed)
[place] Telephone:(336) [phone]   Fax:([phone]  PROGRESS NOTE  Patient Care Team: Donald Prose, MD as PCP - General (Family Medicine)  Hematological/Oncological History # Thrombocytopenia 07/29/2019: WBC 9.5, Hgb 13.4, MCV 96.4, Plt 135 10/08/2020: WBC 6.5, Hgb 15.3, MCV 91.9, Plt 117 01/23/2021: WBC 4.3, Hgb 14.0, MCV 95.4, Plt 89 02/03/2021: CT A/P shows no evidence of splenomegaly or liver disease.  02/25/2021: WBC 5.6, Hgb 14.6, MCV 42, Plt 121. Bone marrow biopsy performed, no evidence of a bone marrow disorder.  06/10/2022: WBC 8.1, Hgb 14.9, Plt 117, MCV 92.8.   Interval History:  Michael James 62 y.o. male with medical history significant for thrombocytopenia of unclear etiology who presents for a follow up visit. The patient's last visit was on 06/04/2021. In the interim since the last visit he has had no major changes in his health.  On exam today Michael James notes he has been healthy overall in the interim since her last visit.  He retired in [place] and has been enjoying his retirement since then.  He reports he likes to "piddle" around, [place]  He reports he has an upcoming trip to [place] he is really looking forward to.  He notes he is not having any trouble with bleeding or bruising.  He has no planned upcoming surgeries.  He is not having any abdominal distention or discomfort.  He does have some chronic hip pain issues which are being addressed with another provider he reports.  He notes that he has not had any issues with bleeding, bruising, or dark stools.  He notes he is at his baseline level of health.  He denies any fevers, chills, sweats, nausea, vomiting or diarrhea.  A full 10 point ROS is listed below.  MEDICAL HISTORY:  Past Medical History:  Diagnosis Date   Left-sided chest wall pain     SURGICAL HISTORY: Past Surgical History:  Procedure Laterality Date   TONSILLECTOMY     VASECTOMY       SOCIAL HISTORY: Social History   Socioeconomic History   Marital status: Married    Spouse name: Not on file   Number of children: 2   Years of education: Not on file   Highest education level: Not on file  Occupational History    Employer: [employer]  Tobacco Use   Smoking status: Every Day    Packs/day: 1.50    Years: 35.00    Total pack years: 52.50    Types: Cigarettes   Smokeless tobacco: Never  Vaping Use   Vaping Use: Never used  Substance and Sexual Activity   Alcohol use: Yes   Drug use: Yes    Types: Marijuana    Comment: current use last was this past weekend   Sexual activity: Not on file  Other Topics Concern   Not on file  Social History Narrative   Not on file   Social Determinants of Health   Financial Resource Strain: Not on file  Food Insecurity: Not on file  Transportation Needs: Not on file  Physical Activity: Not on file  Stress: Not on file  Social Connections: Not on file  Intimate Partner Violence: Not on file    FAMILY HISTORY: Family History  Problem Relation Age of Onset   Sudden death Mother        Undetermined.  No CAD   Heart failure Maternal Grandmother 63    ALLERGIES:  has No Known  Allergies.  MEDICATIONS:  Current Outpatient Medications  Medication Sig Dispense Refill   acetaminophen (TYLENOL) 325 MG tablet Take 650 mg by mouth every 6 (six) hours as needed for mild pain, fever or headache.     Chlorpheniramine Maleate (ALLERGY PO) Take 1 tablet by mouth daily as needed (allergies).     No current facility-administered medications for this visit.    REVIEW OF SYSTEMS:   Constitutional: ( - ) fevers, ( - )  chills , ( - ) night sweats Eyes: ( - ) blurriness of vision, ( - ) double vision, ( - ) watery eyes Ears, nose, mouth, throat, and face: ( - ) mucositis, ( - ) sore throat Respiratory: ( - ) cough, ( - ) dyspnea, ( - ) wheezes Cardiovascular: ( - ) palpitation, ( - ) chest discomfort, ( - ) lower  extremity swelling Gastrointestinal:  ( - ) nausea, ( - ) heartburn, ( - ) change in bowel habits Skin: ( - ) abnormal skin rashes Lymphatics: ( - ) new lymphadenopathy, ( - ) easy bruising Neurological: ( - ) numbness, ( - ) tingling, ( - ) new weaknesses Behavioral/Psych: ( - ) mood change, ( - ) new changes  All other systems were reviewed with the patient and are negative.  PHYSICAL EXAMINATION:  Vitals:   06/10/22 1407  BP: 135/83  Pulse: (!) 57  Resp: 14  Temp: 98 F (36.7 C)  SpO2: 99%   Filed Weights   06/10/22 1407  Weight: 181 lb 9.6 oz (82.4 kg)    GENERAL: well appearing middle aged Caucasian male. alert, no distress and comfortable SKIN: skin color, texture, turgor are normal, no rashes or significant lesions EYES: conjunctiva are pink and non-injected, sclera clear LUNGS: clear to auscultation and percussion with normal breathing effort HEART: regular rate & rhythm and no murmurs and no lower extremity edema Musculoskeletal: no cyanosis of digits and no clubbing  PSYCH: alert & oriented x 3, fluent speech NEURO: no focal motor/sensory deficits  LABORATORY DATA:  I have reviewed the data as listed    Latest Ref Rng & Units 06/10/2022    1:49 PM 06/04/2021   10:08 AM 03/05/2021    3:35 PM  CBC  WBC 4.0 - 10.5 K/uL 8.1  6.0  8.3   Hemoglobin 13.0 - 17.0 g/dL 14.9  15.6  14.3   Hematocrit 39.0 - 52.0 % 41.3  45.5  42.3   Platelets 150 - 400 K/uL 117  115  137        Latest Ref Rng & Units 06/10/2022    1:49 PM 06/04/2021   10:08 AM 03/05/2021    3:48 PM  CMP  Glucose 70 - 99 mg/dL 87  109  91   BUN 6 - 20 mg/dL 14  13  14    Creatinine 0.61 - 1.24 mg/dL 1.08  1.10  1.02   Sodium 135 - 145 mmol/L 139  139  139   Potassium 3.5 - 5.1 mmol/L 3.9  4.1  4.3   Chloride 98 - 111 mmol/L 105  105  104   CO2 22 - 32 mmol/L 29  28  25    Calcium 8.9 - 10.3 mg/dL 9.4  9.7  9.6   Total Protein 6.5 - 8.1 g/dL 7.0  7.3  7.1   Total Bilirubin 0.3 - 1.2 mg/dL 0.4  0.5   0.4   Alkaline Phos 38 - 126 U/L 55  62  66   AST  15 - 41 U/L 17  23  29    ALT 0 - 44 U/L 11  18  22     RADIOGRAPHIC STUDIES: No results found.  ASSESSMENT & PLAN Michael James 62 y.o. male with medical history significant for thrombocytopenia of unclear etiology who presents for a follow up visit.   # Thrombocytopenia of Unclear Etiology -- At this time favor a diagnosis of ITP --Would recommend having the patient return for labs in  6 months with yearly clinic visits.  --No clear etiology noted on the patient's bone marrow biopsy and nutritional studies.  Additionally a CT of the abdomen pelvis did not reveal any splenomegaly or liver disease --If platelets were to drop less than 30 would consider a steroid pulse --labs today show Plt 117, WBC 8.1, Hgb 14.9, Cr 1.08 with normal LFTs  --Return to clinic in 12 months time to reevaluate   No orders of the defined types were placed in this encounter.   All questions were answered. The patient knows to call the clinic with any problems, questions or concerns.  A total of more than 25 minutes were spent on this encounter with face-to-face time and non-face-to-face time, including preparing to see the patient, ordering tests and/or medications, counseling the patient and coordination of care as outlined above.   Ledell Peoples, MD Department of Hematology/Oncology [facility] Phone: [phone] Pager: [phone] Email: [email or web address]  06/10/2022 4:41 PM

## 2022-08-13 IMAGING — CT CT BIOPSY
1 of 4 series · 12 of 32 positions shown, 15 images · non-contrast
Comparison: none

INDICATION: Thrombocytopenia.

[Series 2: i-spiral 5.0 br40 · axial · 0.96mm/px · z∈[-329,-238]mm · 12 of 32 slices shown, 15 images]
[im 3/32  mediastinal]
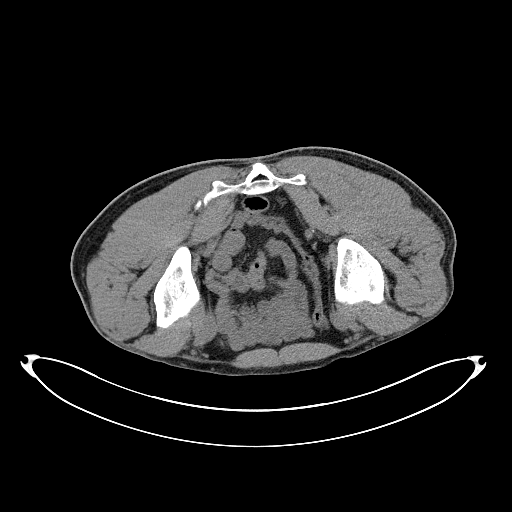
[im 3/32  lung]
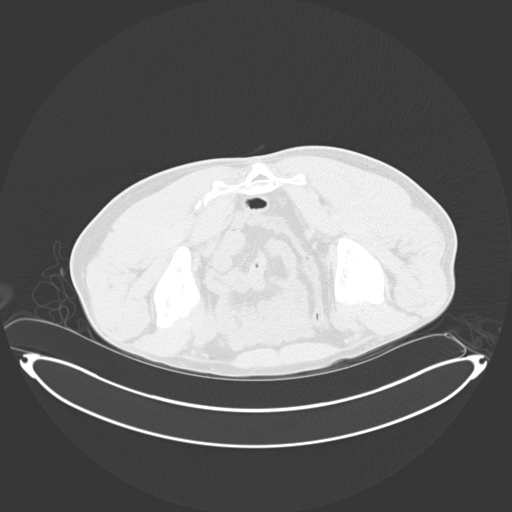
[im 6/32  lung]
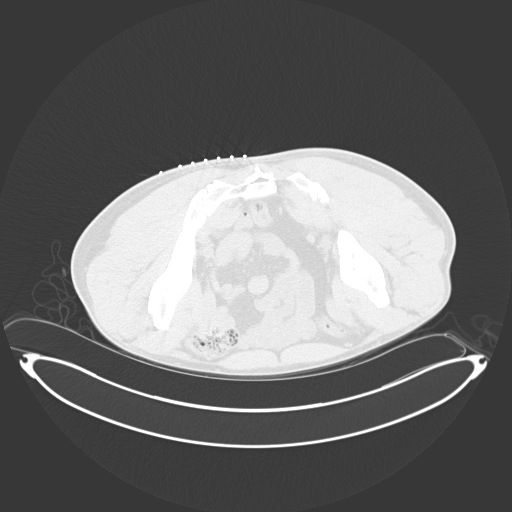
[im 8/32  lung]
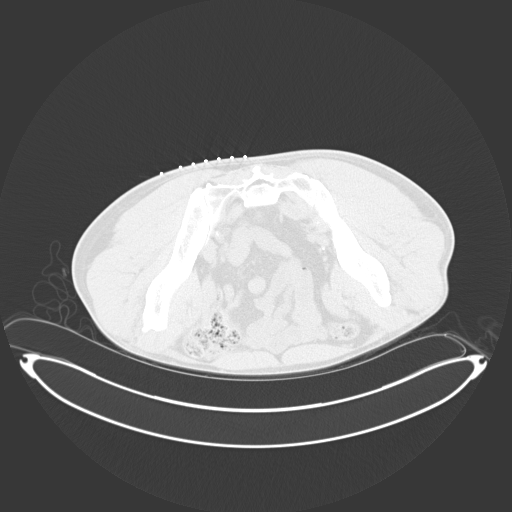
[im 11/32  lung]
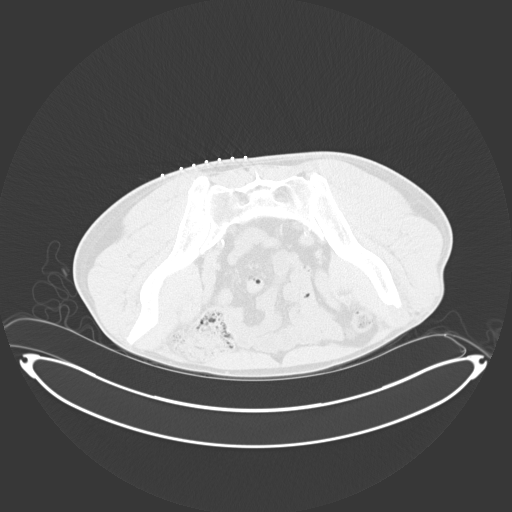
[im 13/32  mediastinal]
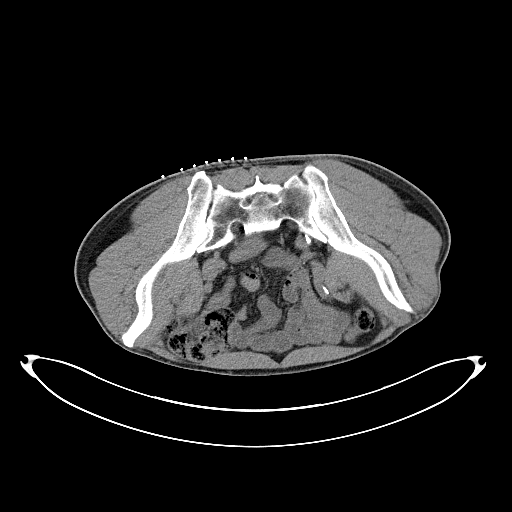
[im 13/32  lung]
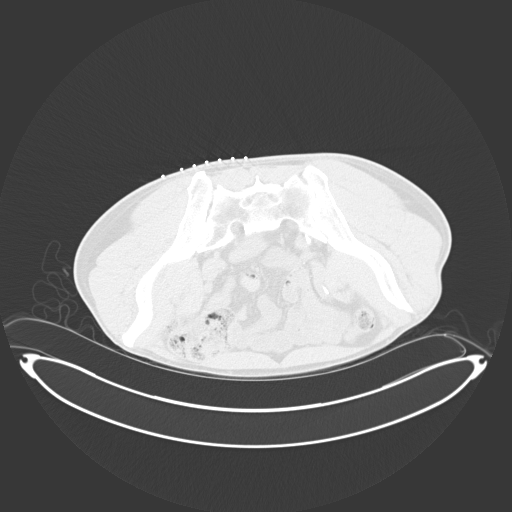
[im 14/32  lung]
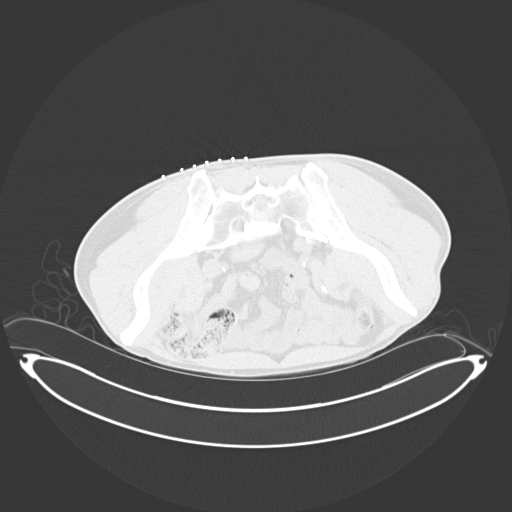
[im 16/32  lung]
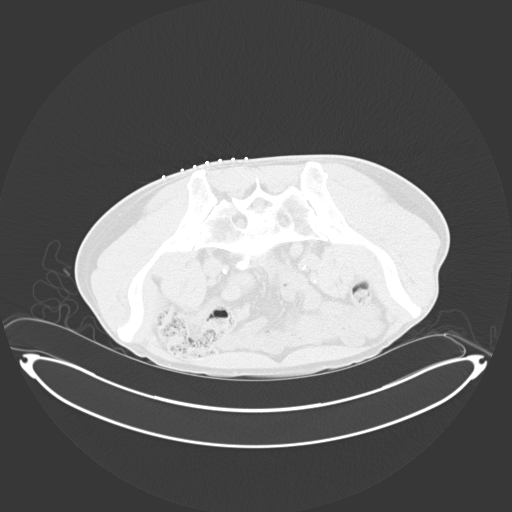
[im 19/32  lung]
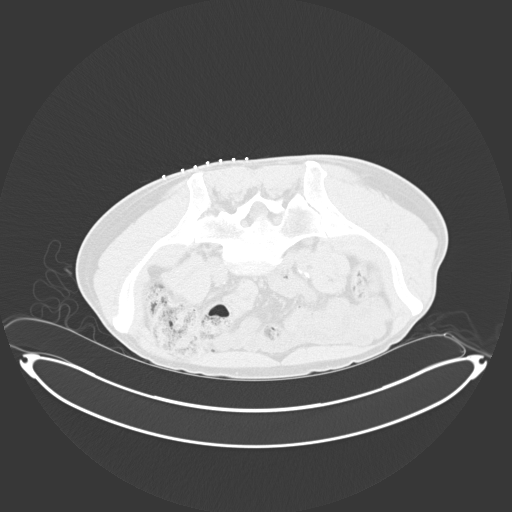
[im 21/32  mediastinal]
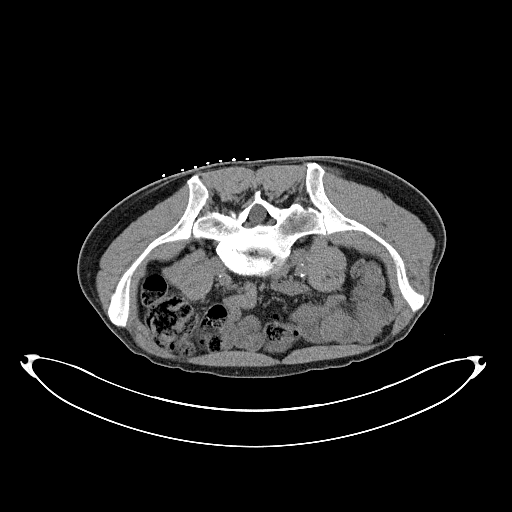
[im 21/32  lung]
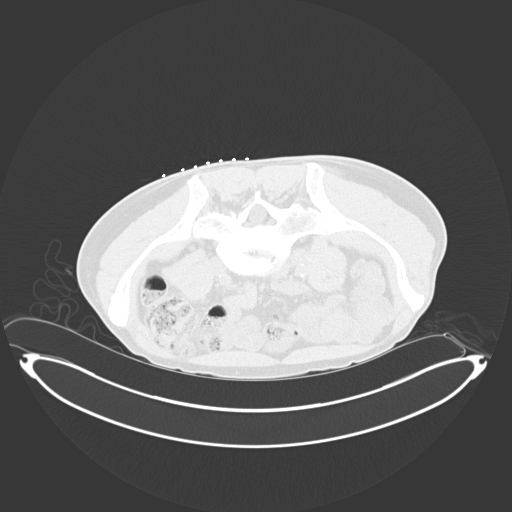
[im 24/32  lung]
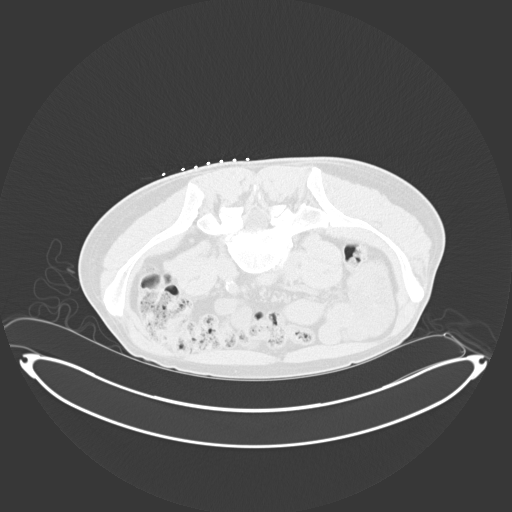
[im 26/32  lung]
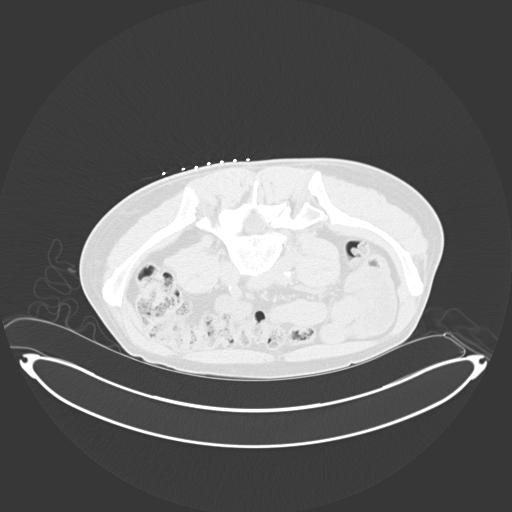
[im 29/32  lung]
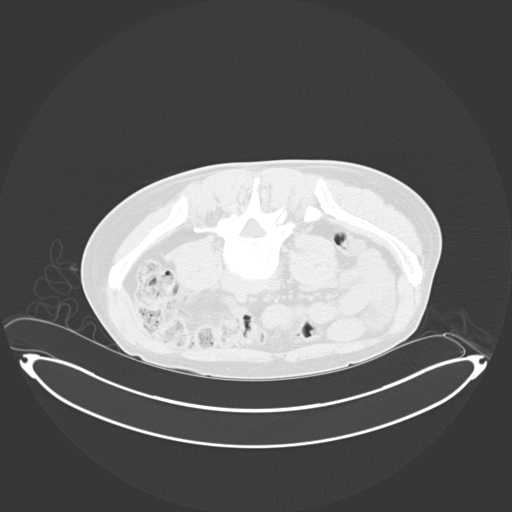

[12 of 32 positions shown; findings below may reference images not displayed]

EXAM:
CT GUIDED BONE MARROW ASPIRATION AND CORE BIOPSY

MEDICATIONS:
None.

ANESTHESIA/SEDATION:
Single agent sedation, with 100 mcg Fentanyl, was administered.

FLUOROSCOPY TIME:  CT dose was not reported.

COMPLICATIONS:
None immediate.

Estimated blood loss: <5 mL

PROCEDURE:
Informed written consent was obtained from the patient after a
thorough discussion of the procedural risks, benefits and
alternatives. All questions were addressed. Maximal Sterile Barrier
Technique was utilized including caps, mask, sterile gowns, sterile
gloves, sterile drape, hand hygiene and skin antiseptic. A timeout
was performed prior to the initiation of the procedure.

The patient was positioned prone and non-contrast localization CT
was performed of the pelvis to demonstrate the iliac marrow spaces.

Maximal barrier sterile technique utilized including caps, mask,
sterile gowns, sterile gloves, large sterile drape, hand hygiene,
and chlorhexidine prep.

Under sterile conditions and local anesthesia, an 11 gauge coaxial
bone biopsy needle was advanced into the RIGHT iliac marrow space.
Needle position was confirmed with CT imaging. Initially, bone
marrow aspiration was performed. Next, the 11 gauge outer cannula
was utilized to obtain a 1 iliac bone marrow core biopsy. Needle was
removed. Hemostasis was obtained with compression. The patient
tolerated the procedure well. Samples were prepared with the
cytotechnologist.
IMPRESSION: Successful CT-guided bone marrow aspiration and biopsy, as above.

## 2022-10-09 ENCOUNTER — Other Ambulatory Visit: Payer: Self-pay | Admitting: Family Medicine

## 2022-10-09 DIAGNOSIS — F17200 Nicotine dependence, unspecified, uncomplicated: Secondary | ICD-10-CM

## 2022-10-09 DIAGNOSIS — M79605 Pain in left leg: Secondary | ICD-10-CM

## 2022-11-09 ENCOUNTER — Ambulatory Visit
Admission: RE | Admit: 2022-11-09 | Discharge: 2022-11-09 | Disposition: A | Discharge status: Home / Self Care | Payer: Commercial Managed Care - HMO | Source: Ambulatory Visit | Attending: Family Medicine | Admitting: Family Medicine

## 2022-11-09 DIAGNOSIS — M79605 Pain in left leg: Secondary | ICD-10-CM

## 2022-11-09 DIAGNOSIS — F17200 Nicotine dependence, unspecified, uncomplicated: Secondary | ICD-10-CM

## 2023-01-13 ENCOUNTER — Ambulatory Visit: Admission: RE | Admit: 2023-01-13 | Payer: Commercial Managed Care - HMO | Source: Ambulatory Visit

## 2023-01-13 ENCOUNTER — Other Ambulatory Visit: Payer: Self-pay | Admitting: Family Medicine

## 2023-01-13 DIAGNOSIS — M25551 Pain in right hip: Secondary | ICD-10-CM

## 2023-06-14 ENCOUNTER — Telehealth: Payer: Self-pay | Admitting: *Deleted

## 2023-06-14 ENCOUNTER — Other Ambulatory Visit: Payer: Self-pay | Admitting: *Deleted

## 2023-06-14 ENCOUNTER — Inpatient Hospital Stay: Payer: Commercial Managed Care - HMO

## 2023-06-14 ENCOUNTER — Other Ambulatory Visit: Payer: Self-pay | Admitting: Hematology and Oncology

## 2023-06-14 ENCOUNTER — Inpatient Hospital Stay: Payer: Commercial Managed Care - HMO | Admitting: Hematology and Oncology

## 2023-06-14 DIAGNOSIS — D696 Thrombocytopenia, unspecified: Secondary | ICD-10-CM

## 2023-06-14 NOTE — Telephone Encounter (Signed)
Received vm message from pt stating he has been diagnosed with shingles as of last week. He is asking what he should do about his appt. TCT patient. No answert but wasd able to leave vm message for him. Advised that we will need to reschedule his appt for 1 month from now. Advised that a scheduler will call him to have today's appt rescheduled.  Received a call back from pt. Advised that we will need to re-schedule his appts today for about 1 month. Pt voiced understanding. Advised a scheduler will be calling him. Appts cancelled for today.

## 2023-06-14 NOTE — Progress Notes (Unsigned)
[facility] [phone]   Fax:([phone]  PROGRESS NOTE  Patient Care Team: Deatra James, MD as PCP - General (Family Medicine)  Hematological/Oncological History # Thrombocytopenia 07/29/2019: WBC 9.5, Hgb 13.4, MCV 96.4, Plt 135 10/08/2020: WBC 6.5, Hgb 15.3, MCV 91.9, Plt 117 01/23/2021: WBC 4.3, Hgb 14.0, MCV 95.4, Plt 89 02/03/2021: CT A/P shows no evidence of splenomegaly or liver disease.  02/25/2021: WBC 5.6, Hgb 14.6, MCV 42, Plt 121. Bone marrow biopsy performed, no evidence of a bone marrow disorder.  06/10/2022: WBC 8.1, Hgb 14.9, Plt 117, MCV 92.8.   Interval History:  Michael James 63 y.o. male with medical history significant for thrombocytopenia of unclear etiology who presents for a follow up visit. The patient's last visit was on 06/10/2022. In the interim since the last visit he has had no major changes in his health.  On exam today Michael James notes ***. He notes that he has not had any issues with bleeding, bruising, or dark stools.  He notes he is at his baseline level of health.  He denies any fevers, chills, sweats, nausea, vomiting or diarrhea.  A full 10 point ROS is listed below.  MEDICAL HISTORY:  Past Medical History:  Diagnosis Date   Left-sided chest wall pain     SURGICAL HISTORY: Past Surgical History:  Procedure Laterality Date   TONSILLECTOMY     VASECTOMY      SOCIAL HISTORY: Social History   Socioeconomic History   Marital status: Married    Spouse name: Not on file   Number of children: 2   Years of education: Not on file   Highest education level: Not on file  Occupational History    Employer: [employer]  Tobacco Use   Smoking status: Every Day    Current packs/day: 1.50    Average packs/day: 1.5 packs/day for 35.0 years (52.5 ttl pk-yrs)    Types: Cigarettes   Smokeless tobacco: Never  Vaping Use   Vaping status: Never Used  Substance and Sexual Activity   Alcohol use: Yes   Drug use:  Yes    Types: Marijuana    Comment: current use last was this past weekend   Sexual activity: Not on file  Other Topics Concern   Not on file  Social History Narrative   Not on file   Social Drivers of Health   Financial Resource Strain: Not on file  Food Insecurity: Not on file  Transportation Needs: Not on file  Physical Activity: Not on file  Stress: Not on file  Social Connections: Not on file  Intimate Partner Violence: Not on file    FAMILY HISTORY: Family History  Problem Relation Age of Onset   Sudden death Mother        Undetermined.  No CAD   Heart failure Maternal Grandmother 63    ALLERGIES:  has no known allergies.  MEDICATIONS:  Current Outpatient Medications  Medication Sig Dispense Refill   acetaminophen (TYLENOL) 325 MG tablet Take 650 mg by mouth every 6 (six) hours as needed for mild pain, fever or headache.     Chlorpheniramine Maleate (ALLERGY PO) Take 1 tablet by mouth daily as needed (allergies).     No current facility-administered medications for this visit.    REVIEW OF SYSTEMS:   Constitutional: ( - ) fevers, ( - )  chills , ( - ) night sweats Eyes: ( - ) blurriness of vision, ( - ) double vision, ( - )  watery eyes Ears, nose, mouth, throat, and face: ( - ) mucositis, ( - ) sore throat Respiratory: ( - ) cough, ( - ) dyspnea, ( - ) wheezes Cardiovascular: ( - ) palpitation, ( - ) chest discomfort, ( - ) lower extremity swelling Gastrointestinal:  ( - ) nausea, ( - ) heartburn, ( - ) change in bowel habits Skin: ( - ) abnormal skin rashes Lymphatics: ( - ) new lymphadenopathy, ( - ) easy bruising Neurological: ( - ) numbness, ( - ) tingling, ( - ) new weaknesses Behavioral/Psych: ( - ) mood change, ( - ) new changes  All other systems were reviewed with the patient and are negative.  PHYSICAL EXAMINATION:  There were no vitals filed for this visit.  There were no vitals filed for this visit.   GENERAL: well appearing middle aged  Caucasian male. alert, no distress and comfortable SKIN: skin color, texture, turgor are normal, no rashes or significant lesions EYES: conjunctiva are pink and non-injected, sclera clear LUNGS: clear to auscultation and percussion with normal breathing effort HEART: regular rate & rhythm and no murmurs and no lower extremity edema Musculoskeletal: no cyanosis of digits and no clubbing  PSYCH: alert & oriented x 3, fluent speech NEURO: no focal motor/sensory deficits  LABORATORY DATA:  I have reviewed the data as listed    Latest Ref Rng & Units 06/10/2022    1:49 PM 06/04/2021   10:08 AM 03/05/2021    3:35 PM  CBC  WBC 4.0 - 10.5 K/uL 8.1  6.0  8.3   Hemoglobin 13.0 - 17.0 g/dL 51.8  84.1  66.0   Hematocrit 39.0 - 52.0 % 41.3  45.5  42.3   Platelets 150 - 400 K/uL 117  115  137        Latest Ref Rng & Units 06/10/2022    1:49 PM 06/04/2021   10:08 AM 03/05/2021    3:48 PM  CMP  Glucose 70 - 99 mg/dL 87  630  91   BUN 6 - 20 mg/dL 14  13  14    Creatinine 0.61 - 1.24 mg/dL 1.60  1.09  3.23   Sodium 135 - 145 mmol/L 139  139  139   Potassium 3.5 - 5.1 mmol/L 3.9  4.1  4.3   Chloride 98 - 111 mmol/L 105  105  104   CO2 22 - 32 mmol/L 29  28  25    Calcium 8.9 - 10.3 mg/dL 9.4  9.7  9.6   Total Protein 6.5 - 8.1 g/dL 7.0  7.3  7.1   Total Bilirubin 0.3 - 1.2 mg/dL 0.4  0.5  0.4   Alkaline Phos 38 - 126 U/L 55  62  66   AST 15 - 41 U/L 17  23  29    ALT 0 - 44 U/L 11  18  22     RADIOGRAPHIC STUDIES: No results found.  ASSESSMENT & PLAN Michael James 63 y.o. male with medical history significant for thrombocytopenia of unclear etiology who presents for a follow up visit.   # Thrombocytopenia of Unclear Etiology -- At this time favor a diagnosis of ITP --Would recommend having the patient return for labs in  6 months with yearly clinic visits.  --No clear etiology noted on the patient's bone marrow biopsy and nutritional studies.  Additionally a CT of the abdomen pelvis did  not reveal any splenomegaly or liver disease --If platelets were to drop less than 30 would consider  a steroid pulse --labs today show Plt *** with normal LFTs  --Return to clinic in 12 months time to reevaluate   No orders of the defined types were placed in this encounter.   All questions were answered. The patient knows to call the clinic with any problems, questions or concerns.  A total of more than 25 minutes were spent on this encounter with face-to-face time and non-face-to-face time, including preparing to see the patient, ordering tests and/or medications, counseling the patient and coordination of care as outlined above.   Ulysees Barns, MD Department of Hematology/Oncology [facility] Phone: [phone] Pager: [phone] Email: [email or web address]  06/14/2023 7:49 AM

## 2023-07-26 ENCOUNTER — Inpatient Hospital Stay: Payer: Commercial Managed Care - HMO | Admitting: Hematology and Oncology

## 2023-07-26 ENCOUNTER — Inpatient Hospital Stay: Payer: Commercial Managed Care - HMO | Attending: Hematology and Oncology

## 2023-07-26 VITALS — BP 146/82 | HR 55 | Temp 97.6°F | Resp 16 | Wt 146.0 lb

## 2023-07-26 DIAGNOSIS — F17210 Nicotine dependence, cigarettes, uncomplicated: Secondary | ICD-10-CM | POA: Insufficient documentation

## 2023-07-26 DIAGNOSIS — D696 Thrombocytopenia, unspecified: Secondary | ICD-10-CM

## 2023-07-26 LAB — CMP (CANCER CENTER ONLY)
ALT: 14 U/L (ref 0–44)
AST: 18 U/L (ref 15–41)
Albumin: 4.3 g/dL (ref 3.5–5.0)
Alkaline Phosphatase: 61 U/L (ref 38–126)
Anion gap: 6 (ref 5–15)
BUN: 13 mg/dL (ref 8–23)
CO2: 28 mmol/L (ref 22–32)
Calcium: 9.1 mg/dL (ref 8.9–10.3)
Chloride: 106 mmol/L (ref 98–111)
Creatinine: 1.05 mg/dL (ref 0.61–1.24)
GFR, Estimated: >60 mL/min (ref >60)
Glucose, Bld: 118 mg/dL — ABNORMAL HIGH (ref 70–99)
Potassium: 3.6 mmol/L (ref 3.5–5.1)
Sodium: 140 mmol/L (ref 135–145)
Total Bilirubin: 0.4 mg/dL (ref 0.0–1.2)
Total Protein: 6.9 g/dL (ref 6.5–8.1)

## 2023-07-26 LAB — CBC WITH DIFFERENTIAL (CANCER CENTER ONLY)
Abs Immature Granulocytes: 0.02 10*3/uL (ref 0.00–0.07)
Basophils Absolute: 0 10*3/uL (ref 0.0–0.1)
Basophils Relative: 1 %
Eosinophils Absolute: 0.2 10*3/uL (ref 0.0–0.5)
Eosinophils Relative: 3 %
HCT: 40.8 % (ref 39.0–52.0)
Hemoglobin: 14.2 g/dL (ref 13.0–17.0)
Immature Granulocytes: 0 %
Lymphocytes Relative: 31 %
Lymphs Abs: 2.3 10*3/uL (ref 0.7–4.0)
MCH: 32.2 pg (ref 26.0–34.0)
MCHC: 34.8 g/dL (ref 30.0–36.0)
MCV: 92.5 fL (ref 80.0–100.0)
Monocytes Absolute: 0.5 10*3/uL (ref 0.1–1.0)
Monocytes Relative: 7 %
Neutro Abs: 4.5 10*3/uL (ref 1.7–7.7)
Neutrophils Relative %: 58 %
Platelet Count: 124 10*3/uL — ABNORMAL LOW (ref 150–400)
RBC: 4.41 MIL/uL (ref 4.22–5.81)
RDW: 12.8 % (ref 11.5–15.5)
WBC Count: 7.5 10*3/uL (ref 4.0–10.5)
nRBC: 0 % (ref 0.0–0.2)

## 2023-07-26 LAB — WBC/PLT IN CITRATE

## 2023-07-26 LAB — IMMATURE PLATELET FRACTION: Immature Platelet Fraction: 4.7 % (ref 1.2–8.6)

## 2023-07-26 NOTE — Progress Notes (Unsigned)
 [facility] [phone]) [phone]   Fax:([phone]  PROGRESS NOTE  Patient Care Team: Deatra James, MD as PCP - General (Family Medicine)  Hematological/Oncological History # Thrombocytopenia 07/29/2019: WBC 9.5, Hgb 13.4, MCV 96.4, Plt 135 10/08/2020: WBC 6.5, Hgb 15.3, MCV 91.9, Plt 117 01/23/2021: WBC 4.3, Hgb 14.0, MCV 95.4, Plt 89 02/03/2021: CT A/P shows no evidence of splenomegaly or liver disease.  02/25/2021: WBC 5.6, Hgb 14.6, MCV 42, Plt 121. Bone marrow biopsy performed, no evidence of a bone marrow disorder.  06/10/2022: WBC 8.1, Hgb 14.9, Plt 117, MCV 92.8.   Interval History:  Michael James 63 y.o. male with medical history significant for thrombocytopenia of unclear etiology who presents for a follow up visit. The patient's last visit was on 06/04/2021. In the interim since the last visit he has had no major changes in his health.  On exam today Mr. Odonell notes he has had no major changes in his health interim since her last visit.  He reports that he does have pain in his right hip with no clear findings on MRI.  He reports that rehabilitation has not been helping much.  He reports that he did have shingles back in February 2025.  He reports it was a "mild case".  It is mostly around his belt line.  He reports he can still sometimes feel some of the nerve pain.  He notes that he did recently have a teeth cleaning but is had no recent dental procedures or surgeries.  He reports he had no trouble with bleeding, bruising, or dark stools.  He reports that his energy levels are so-so.  He notes he is at his baseline level of health.  He denies any fevers, chills, sweats, nausea, vomiting or diarrhea.  A full 10 point ROS is listed below.  MEDICAL HISTORY:  Past Medical History:  Diagnosis Date   Left-sided chest wall pain     SURGICAL HISTORY: Past Surgical History:  Procedure Laterality Date   TONSILLECTOMY     VASECTOMY      SOCIAL HISTORY: Social  History   Socioeconomic History   Marital status: Married    Spouse name: Not on file   Number of children: 2   Years of education: Not on file   Highest education level: Not on file  Occupational History    Employer: [employer]  Tobacco Use   Smoking status: Every Day    Current packs/day: 1.50    Average packs/day: 1.5 packs/day for 35.0 years (52.5 ttl pk-yrs)    Types: Cigarettes   Smokeless tobacco: Never  Vaping Use   Vaping status: Never Used  Substance and Sexual Activity   Alcohol use: Yes   Drug use: Yes    Types: Marijuana    Comment: current use last was this past weekend   Sexual activity: Not on file  Other Topics Concern   Not on file  Social History Narrative   Not on file   Social Drivers of Health   Financial Resource Strain: Not on file  Food Insecurity: Not on file  Transportation Needs: Not on file  Physical Activity: Not on file  Stress: Not on file  Social Connections: Not on file  Intimate Partner Violence: Not on file    FAMILY HISTORY: Family History  Problem Relation Age of Onset   Sudden death Mother        Undetermined.  No CAD   Heart failure Maternal Grandmother 3  ALLERGIES:  has no known allergies.  MEDICATIONS:  Current Outpatient Medications  Medication Sig Dispense Refill   acetaminophen (TYLENOL) 325 MG tablet Take 650 mg by mouth every 6 (six) hours as needed for mild pain, fever or headache.     Chlorpheniramine Maleate (ALLERGY PO) Take 1 tablet by mouth daily as needed (allergies).     No current facility-administered medications for this visit.    REVIEW OF SYSTEMS:   Constitutional: ( - ) fevers, ( - )  chills , ( - ) night sweats Eyes: ( - ) blurriness of vision, ( - ) double vision, ( - ) watery eyes Ears, nose, mouth, throat, and face: ( - ) mucositis, ( - ) sore throat Respiratory: ( - ) cough, ( - ) dyspnea, ( - ) wheezes Cardiovascular: ( - ) palpitation, ( - ) chest discomfort, ( - ) lower  extremity swelling Gastrointestinal:  ( - ) nausea, ( - ) heartburn, ( - ) change in bowel habits Skin: ( - ) abnormal skin rashes Lymphatics: ( - ) new lymphadenopathy, ( - ) easy bruising Neurological: ( - ) numbness, ( - ) tingling, ( - ) new weaknesses Behavioral/Psych: ( - ) mood change, ( - ) new changes  All other systems were reviewed with the patient and are negative.  PHYSICAL EXAMINATION:  Vitals:   07/26/23 1520  BP: (!) 146/82  Pulse: (!) 55  Resp: 16  Temp: 97.6 F (36.4 C)  SpO2: 98%    Filed Weights   07/26/23 1520  Weight: 146 lb (66.2 kg)     GENERAL: well appearing middle aged Caucasian male. alert, no distress and comfortable SKIN: skin color, texture, turgor are normal, no rashes or significant lesions EYES: conjunctiva are pink and non-injected, sclera clear LUNGS: clear to auscultation and percussion with normal breathing effort HEART: regular rate & rhythm and no murmurs and no lower extremity edema Musculoskeletal: no cyanosis of digits and no clubbing  PSYCH: alert & oriented x 3, fluent speech NEURO: no focal motor/sensory deficits  LABORATORY DATA:  I have reviewed the data as listed    Latest Ref Rng & Units 07/26/2023    2:43 PM 06/10/2022    1:49 PM 06/04/2021   10:08 AM  CBC  WBC 4.0 - 10.5 K/uL 7.5  8.1  6.0   Hemoglobin 13.0 - 17.0 g/dL 08.6  57.8  46.9   Hematocrit 39.0 - 52.0 % 40.8  41.3  45.5   Platelets 150 - 400 K/uL 124  117  115        Latest Ref Rng & Units 07/26/2023    2:43 PM 06/10/2022    1:49 PM 06/04/2021   10:08 AM  CMP  Glucose 70 - 99 mg/dL 629  87  528   BUN 8 - 23 mg/dL 13  14  13    Creatinine 0.61 - 1.24 mg/dL 4.13  2.44  0.10   Sodium 135 - 145 mmol/L 140  139  139   Potassium 3.5 - 5.1 mmol/L 3.6  3.9  4.1   Chloride 98 - 111 mmol/L 106  105  105   CO2 22 - 32 mmol/L 28  29  28    Calcium 8.9 - 10.3 mg/dL 9.1  9.4  9.7   Total Protein 6.5 - 8.1 g/dL 6.9  7.0  7.3   Total Bilirubin 0.0 - 1.2 mg/dL 0.4   0.4  0.5   Alkaline Phos 38 - 126 U/L 61  55  62   AST 15 - 41 U/L 18  17  23    ALT 0 - 44 U/L 14  11  18     RADIOGRAPHIC STUDIES: No results found.  ASSESSMENT & PLAN DISHAWN BHARGAVA 63 y.o. male with medical history significant for thrombocytopenia of unclear etiology who presents for a follow up visit.   # Thrombocytopenia of Unclear Etiology -- At this time favor a diagnosis of ITP --No clear etiology noted on the patient's bone marrow biopsy and nutritional studies.  Additionally a CT of the abdomen pelvis did not reveal any splenomegaly or liver disease --If platelets were to drop less than 30 would consider a steroid pulse --labs today show white blood cell 7.5, hemoglobin 14.2, MCV 92.5, platelets 124 --Return to clinic in 12 months time to reevaluate   No orders of the defined types were placed in this encounter.   All questions were answered. The patient knows to call the clinic with any problems, questions or concerns.  A total of more than 25 minutes were spent on this encounter with face-to-face time and non-face-to-face time, including preparing to see the patient, ordering tests and/or medications, counseling the patient and coordination of care as outlined above.   Ulysees Barns, MD Department of Hematology/Oncology [facility] at [facility] Phone: [phone] Pager: [phone] Email: [email or web address]  07/29/2023 9:33 AM

## 2024-03-03 ENCOUNTER — Other Ambulatory Visit: Payer: Self-pay | Admitting: Family Medicine

## 2024-03-03 DIAGNOSIS — F17210 Nicotine dependence, cigarettes, uncomplicated: Secondary | ICD-10-CM

## 2024-07-26 ENCOUNTER — Inpatient Hospital Stay

## 2024-07-26 ENCOUNTER — Inpatient Hospital Stay: Admitting: Hematology and Oncology
